# Patient Record
Sex: Female | Born: 1990 | Race: White | Hispanic: No | Marital: Single | Smoking: Current every day smoker
Health system: Southern US, Community
[De-identification: ages and names within clinical notes are randomized; demographics above are authoritative.]

## PROBLEM LIST (undated history)

## (undated) DIAGNOSIS — R569 Unspecified convulsions: Secondary | ICD-10-CM

## (undated) DIAGNOSIS — G8929 Other chronic pain: Secondary | ICD-10-CM

## (undated) DIAGNOSIS — M549 Dorsalgia, unspecified: Secondary | ICD-10-CM

## (undated) HISTORY — PX: BACK SURGERY: SHX140

---

## 2012-02-13 ENCOUNTER — Emergency Department (HOSPITAL_COMMUNITY)
Admission: EM | Admit: 2012-02-13 | Discharge: 2012-02-13 | Disposition: A | Discharge status: Home / Self Care | Payer: Self-pay | Attending: Emergency Medicine | Admitting: Emergency Medicine

## 2012-02-13 ENCOUNTER — Encounter (HOSPITAL_COMMUNITY): Payer: Self-pay

## 2012-02-13 DIAGNOSIS — F17200 Nicotine dependence, unspecified, uncomplicated: Secondary | ICD-10-CM | POA: Insufficient documentation

## 2012-02-13 DIAGNOSIS — K0889 Other specified disorders of teeth and supporting structures: Secondary | ICD-10-CM

## 2012-02-13 DIAGNOSIS — K089 Disorder of teeth and supporting structures, unspecified: Secondary | ICD-10-CM | POA: Insufficient documentation

## 2012-02-13 MED ORDER — ERYTHROMYCIN BASE 250 MG PO TABS: 250.0000 mg | ORAL_TABLET | Freq: Four times a day (QID) | ORAL | Status: AC

## 2012-02-13 MED ORDER — HYDROCODONE-ACETAMINOPHEN 5-325 MG PO TABS
2.0000 | ORAL_TABLET | Freq: Once | ORAL | Status: AC
  Administered 2012-02-13: 2 via ORAL
  Filled 2012-02-13: qty 2

## 2012-02-13 MED ORDER — BUPIVACAINE HCL (PF) 0.5 % IJ SOLN
INTRAMUSCULAR | Status: AC
  Filled 2012-02-13: qty 10

## 2012-02-13 MED ORDER — HYDROCODONE-ACETAMINOPHEN 5-325 MG PO TABS: ORAL_TABLET | ORAL | Status: DC

## 2012-02-13 MED ORDER — BUPIVACAINE HCL (PF) 0.5 % IJ SOLN
10.0000 mL | Freq: Once | INTRAMUSCULAR | Status: AC
  Administered 2012-02-13: 10 mL

## 2012-02-13 NOTE — Discharge Instructions (Signed)
Dental Pain A tooth ache may be caused by cavities (tooth decay). Cavities expose the nerve of the tooth to air and hot or cold temperatures. It may come from an infection or abscess (also called a boil or furuncle) around your tooth. It is also often caused by dental caries (tooth decay). This causes the pain you are having. DIAGNOSIS  Your caregiver can diagnose this problem by exam. TREATMENT   If caused by an infection, it may be treated with medications which kill germs (antibiotics) and pain medications as prescribed by your caregiver. Take medications as directed.   Only take over-the-counter or prescription medicines for pain, discomfort, or fever as directed by your caregiver.   Whether the tooth ache today is caused by infection or dental disease, you should see your dentist as soon as possible for further care.  SEEK MEDICAL CARE IF: The exam and treatment you received today has been provided on an emergency basis only. This is not a substitute for complete medical or dental care. If your problem worsens or new problems (symptoms) appear, and you are unable to meet with your dentist, call or return to this location. SEEK IMMEDIATE MEDICAL CARE IF:   You have a fever.   You develop redness and swelling of your face, jaw, or neck.   You are unable to open your mouth.   You have severe pain uncontrolled by pain medicine.  MAKE SURE YOU:   Understand these instructions.   Will watch your condition.   Will get help right away if you are not doing well or get worse.  Document Released: 11/10/2005 Document Revised: 10/30/2011 Document Reviewed: 06/28/2008 [employer] Patient Information 2012 [employer]  RESOURCE GUIDE  Dental Problems  Patients with Medicaid: [facility]                     [facility] [address]                                           [address] Phone:  [phone]                                                   Phone:  [phone]  If unable to pay or uninsured, contact:  [facility] or [facility] to become qualified for the adult dental clinic.  Chronic Pain Problems Contact [facility]  [phone] Patients need to be referred by their primary care doctor.  Insufficient Money for Medicine Contact [employer]:  call "211" or [facility] 271-5999.  No Primary Care Doctor Call [facility]  [phone] Other agencies that provide inexpensive medical care    [facility]  [phone]    [facility]  [phone]    [facility]  [phone]    Women[facility] Clinic  [phone]    [facility]  [phone]    [facility]  [phone]  Psychological Services [facility]  [phone] [facility]  [phone] [facility]   [phone] (emergency services 641-4993)  Substance Abuse Resources [facility]  336-882-2125 [facility] 336-784-9470 [facility] 336-285-9073 [facility] (252)811-6008 Residential & Outpatient Substance Abuse Program  252 246 1162  Abuse/Neglect [facility] Child Abuse Hotline (720)545-1402 [facility] Child Abuse Hotline 636-302-5053 (After Hours)  Emergency Shelter [facility] 442-414-9869  M[facility] Homes [facility] 972-037-7062 [facility] 678-043-1397  MRSA Hotline #:   918-326-4868    [facility] Resources  [facility]     [employer]                          [facility] [address] [place]                       [address]      [address]  Blondell Reveal Phone:  [phone]                                   Phone:  [phone]                 Phone:  [phone]  [facility] Phone:   ([phone]  [facility] Child Abuse Hotline [phone] (After Hours)      Narcotic and benzodiazepine use may cause drowsiness, slowed breathing or dependence.  Please use with caution and do not drive, operate machinery or watch young children alone while taking them.  Taking combinations of these medications or drinking alcohol will potentiate these effects.

## 2012-02-13 NOTE — ED Notes (Signed)
Pt states that for the past week she has been having pain in her left lower tooth area. She states that the area feels swollen. Tylenol pta with no relief.

## 2012-02-13 NOTE — ED Provider Notes (Signed)
History     CSN: [id number]  Arrival date & time 02/13/12  7829   First MD Initiated Contact with Patient 02/13/12 [phone]      Chief Complaint  Patient presents with  . Dental Pain    (Consider location/radiation/quality/duration/timing/severity/associated sxs/prior treatment) HPI Comments: Pt reports gradually worsening pain to teeth in left lower jaw.  Pt has had wisdom teethj removed.  Last had fillings placed by dentist in [place], but unfortunately now staying ina homeless shelter, no dental insurance, no current dentist.  No fevers, chills.  Pain with chewing and on occasion with hot or cold air, not able to smoke cigarettes.  No difficulty swallowing, stiff neck.  Pain worse for past 4-5 days.  Unable to sleep at all last night due to persistent severe pain.    Patient is a 21 y.o. female presenting with tooth pain. The history is provided by the patient and a friend.  Dental PainPrimary symptoms do not include fever, shortness of breath or sore throat.    History reviewed. No pertinent past medical history.  Past Surgical History  Procedure Date  . Back surgery     rod placement.    History reviewed. No pertinent family history.  History  Substance Use Topics  . Smoking status: Current Everyday Smoker -- 0.5 packs/day  . Smokeless tobacco: Not on file  . Alcohol Use: No    OB History    Grav Para Term Preterm Abortions TAB SAB Ect Mult Living                  Review of Systems  Constitutional: Negative for fever and chills.  HENT: Positive for dental problem. Negative for sore throat.   Respiratory: Negative for shortness of breath.     Allergies  Penicillins  Home Medications   Current Outpatient Rx  Name Route Sig Dispense Refill  . ERYTHROMYCIN BASE 250 MG PO TABS Oral Take 1 tablet (250 mg total) by mouth every 6 (six) hours. 28 tablet 0  . HYDROCODONE-ACETAMINOPHEN 5-325 MG PO TABS  1-2 tablets po q 6 hours prn moderate to severe pain 20 tablet  0    BP 117/78  Pulse 78  Temp(Src) 97.7 F (36.5 C) (Oral)  Resp 20  SpO2 100%  LMP 02/02/2012  Physical Exam  Nursing note and vitals reviewed. Constitutional: She appears well-developed and well-nourished. She appears distressed.  HENT:  Head: Normocephalic.  Mouth/Throat: Uvula is midline, oropharynx is clear and moist and mucous membranes are normal.         No sig gum swelling, pus drainage  Neck: Full passive range of motion without pain. Neck supple.       No sig lymphadenopathy  Skin: Skin is warm and dry. No rash noted. She is not diaphoretic.    ED Course  NERVE BLOCK Date/Time: 02/13/2012 10:10 AM Performed by: Lear Ng. Authorized by: Lear Ng Consent: Verbal consent obtained. Risks and benefits: risks, benefits and alternatives were discussed Consent given by: patient Patient understanding: patient states understanding of the procedure being performed Patient consent: the patient's understanding of the procedure matches consent given Patient identity confirmed: verbally with patient Time out: Immediately prior to procedure a "time out" was called to verify the correct patient, procedure, equipment, support staff and site/side marked as required. Indications: pain relief Body area: face/mouth Nerve: inferior alveolar Laterality: left Patient sedated: no Patient position: sitting Needle gauge: 27 G Location technique: anatomical landmarks Local anesthetic: bupivacaine 0.5% without epinephrine  Anesthetic total: 2 ml Outcome: pain improved Comments: Minimal bleeding   (including critical care time)  Labs Reviewed - No data to display No results found.   1. Dentalgia     11:01 AM Dr. Jacqualin Combes office called, will see pt at 10 AM Monday.    MDM  Pt iwht no visible occult trauma, no obv cavities, no abscess.  Nerve block performed.  Will try to make an appt for her with dentist, Dr. Oswaldo Done who is on call for adult . Informed pt that  we will try to arrange appt.  She doesn't have dental insurance at present.  Will give her Rx for erythromycin and norco to last weekend.          Gavin Pound. Aldine Chakraborty, MD 02/13/12 1101

## 2012-02-13 NOTE — ED Notes (Signed)
Physician Assisted Procedure: Assisted with dental block. Will continue to assess for pain

## 2012-02-16 ENCOUNTER — Emergency Department (HOSPITAL_COMMUNITY)
Admission: EM | Admit: 2012-02-16 | Discharge: 2012-02-16 | Disposition: A | Discharge status: Home / Self Care | Payer: Self-pay | Attending: Emergency Medicine | Admitting: Emergency Medicine

## 2012-02-16 ENCOUNTER — Encounter (HOSPITAL_COMMUNITY): Payer: Self-pay

## 2012-02-16 DIAGNOSIS — F17200 Nicotine dependence, unspecified, uncomplicated: Secondary | ICD-10-CM | POA: Insufficient documentation

## 2012-02-16 DIAGNOSIS — K0262 Dental caries on smooth surface penetrating into dentin: Secondary | ICD-10-CM

## 2012-02-16 MED ORDER — OXYCODONE-ACETAMINOPHEN 5-325 MG PO TABS
2.0000 | ORAL_TABLET | Freq: Once | ORAL | Status: AC
  Administered 2012-02-16: 2 via ORAL
  Filled 2012-02-16: qty 2

## 2012-02-16 MED ORDER — CLINDAMYCIN HCL 300 MG PO CAPS
300.0000 mg | ORAL_CAPSULE | Freq: Once | ORAL | Status: AC
  Administered 2012-02-16: 300 mg via ORAL
  Filled 2012-02-16: qty 1

## 2012-02-16 NOTE — ED Provider Notes (Signed)
Medical screening examination/treatment/procedure(s) were performed by non-physician practitioner and as supervising physician I was immediately available for consultation/collaboration.   Vida Roller, MD 02/16/12 [phone]

## 2012-02-16 NOTE — ED Notes (Signed)
Patient c/o  Left-sided facial/jaw pain x 1 week. Reports unable to chew/eat d/t pain.

## 2012-02-16 NOTE — ED Notes (Signed)
Per [facility]- pt seen at [facility] 2 days ago and given norco,  Pt reports no change in pain. Pt report jaw pain left side x 3 days.  Pt reports dental caries on several teeth

## 2012-02-16 NOTE — Discharge Instructions (Signed)
Dental Caries  Tooth decay (dental caries, cavities) is the most common of all oral diseases. It occurs in all ages but is more common in children and young adults.  CAUSES  Bacteria in your mouth combine with foods (particularly sugars and starches) to produce plaque. Plaque is a substance that sticks to the hard surfaces of teeth. The bacteria in the plaque produce acids that attack the enamel of teeth. Repeated acid attacks dissolve the enamel and create holes in the teeth. Root surfaces of teeth may also get these holes.  Other contributing factors include:   Frequent snacking and drinking of cavity-producing foods and liquids.   Poor oral hygiene.   Dry mouth.   Substance abuse such as methamphetamine.   Broken or poor fitting dental restorations.   Eating disorders.   Gastroesophageal reflux disease (GERD).   Certain radiation treatments to the head and neck.  SYMPTOMS  At first, dental decay appears as white, chalky areas on the enamel. In this early stage, symptoms are seldom present. As the decay progresses, pits and holes may appear on the enamel surfaces. Progression of the decay will lead to softening of the hard layers of the tooth. At this point you may experience some pain or achy feeling after sweet, hot, or cold foods or drinks are consumed. If left untreated, the decay will reach the internal structures of the tooth and produce severe pain. Extensive dental treatment, such as root canal therapy, may be needed to save the tooth at this late stage of decay development.  DIAGNOSIS  Most cavities will be detected during regular check-ups. A thorough medical and dental history will be taken by the dentist. The dentist will use instruments to check the surfaces of your teeth for any breakdown or discoloration. Some dentists have special instruments, such as lasers, that detect tooth decay. Dental X-rays may also show some cavities that are not visible to the eye (such as between  the contact areas of the teeth). TREATMENT  Treatment involves removal of the tooth decay and replacement with a restorative material such as silver, gold, or composite (white) material. However, if the decay involves a large area of the tooth and there is little remaining healthy tooth structure, a cap (crown) will be fitted over the remaining structure. If the decay involves the center part of the tooth (pulp), root canal treatment will be needed before any type of dental restoration is placed. If the tooth is severely destroyed by the decay process, leaving the remaining tooth structures unrestorable, the tooth will need to be pulled (extracted). Some early tooth decay may be reversed by fluoride treatments and thorough brushing and flossing at home. PREVENTION   Eat healthy foods. Restrict the amount of sugary, starchy foods and liquids you consume. Avoid frequent snacking and drinking of unhealthy foods and liquids.   Sealants can help with prevention of cavities. Sealants are composite resins applied onto the biting surfaces of teeth at risk for decay. They smooth out the pits and grooves and prevent food from being trapped in them. This is done in early childhood before tooth decay has started.   Fluoride tablets may also be prescribed to children between 6 months and 10 years of age if your drinking water is not fluoridated. The fluoride absorbed by the tooth enamel makes teeth less susceptible to decay. Thorough daily cleaning with a toothbrush and dental floss is the best way to prevent cavities. Use of a fluoride toothpaste is highly recommended. Fluoride mouth rinses   may be used in specific cases.   Topical application of fluoride by your dentist is important in children.   Regular visits with a dentist for checkups and cleanings are also important.  SEEK IMMEDIATE DENTAL CARE IF:  You have a fever.   You develop redness and swelling of your face, jaw, or neck.   You develop swelling  around a tooth.   You are unable to open your mouth or cannot swallow.   You have severe pain uncontrolled by pain medicine.  Document Released: 08/02/2002 Document Revised: 10/30/2011 Document Reviewed: 04/17/2011 [facility] Patient Information 2012 [place], [place]. X-ray keep her dental appointment today

## 2012-02-16 NOTE — ED Provider Notes (Signed)
History     CSN: [id number]  Arrival date & time 02/16/12  0037   First MD Initiated Contact with Patient 02/16/12 0310      Chief Complaint  Patient presents with  . Jaw Pain  . Dental Pain    (Consider location/radiation/quality/duration/timing/severity/associated sxs/prior treatment) HPI Comments: Patient was seen 3 days ago at [facility] for cavity.again, arrange for a dental appointment for her but this morning.  She comes in complaining of increased pain, not being controlled by the Vicodin that she is taking at home.  She does have some submental tenderness, as well  Patient is a 21 y.o. female presenting with tooth pain. The history is provided by the patient.  Dental PainThe primary symptoms include oral lesions. Primary symptoms do not include dental injury, oral bleeding, headaches, fever or sore throat. The symptoms began more than 1 week ago. The symptoms are unchanged. The symptoms occur constantly.  Additional symptoms include: dental sensitivity to temperature and jaw pain. Additional symptoms do not include: gum tenderness, purulent gums, trismus, facial swelling and trouble swallowing.    History reviewed. No pertinent past medical history.  Past Surgical History  Procedure Date  . Back surgery     rod placement.    No family history on file.  History  Substance Use Topics  . Smoking status: Current Everyday Smoker -- 0.5 packs/day  . Smokeless tobacco: Not on file  . Alcohol Use: No    OB History    Grav Para Term Preterm Abortions TAB SAB Ect Mult Living                  Review of Systems  Constitutional: Negative for fever.  HENT: Positive for dental problem. Negative for congestion, sore throat, facial swelling, mouth sores and trouble swallowing.   Neurological: Negative for dizziness and headaches.    Allergies  Penicillins  Home Medications   Current Outpatient Rx  Name Route Sig Dispense Refill  . ACETAMINOPHEN 500 MG PO TABS Oral  Take 1,000 mg by mouth every 6 (six) hours as needed. pain    . ERYTHROMYCIN BASE 250 MG PO TABS Oral Take 1 tablet (250 mg total) by mouth every 6 (six) hours. 28 tablet 0  . HYDROCODONE-ACETAMINOPHEN 5-325 MG PO TABS  1-2 tablets po q 6 hours prn moderate to severe pain 20 tablet 0    BP 145/93  Pulse 93  Temp(Src) 97.9 F (36.6 C) (Oral)  SpO2 100%  LMP 02/02/2012  Physical Exam  Constitutional: She appears well-developed and well-nourished.  HENT:  Head: Normocephalic.  Mouth/Throat: Uvula is midline. Dental caries present.       Patient has submental midline tenderness.  No gum swelling  Eyes: Pupils are equal, round, and reactive to light.  Neck: Normal range of motion.  Cardiovascular: Normal rate.   Pulmonary/Chest: Effort normal.  Musculoskeletal: Normal range of motion.  Neurological: She is alert.  Skin: Skin is warm.    ED Course  Procedures (including critical care time)  Labs Reviewed - No data to display No results found.   No diagnosis found.    MDM  Since the patient has a dental appointment will provide additional pain control in the department.  Also have started clindamycin 300 mg for potential dental infection and encourage patient to keep her appointment today I placed a small amount of dental cement in the cavity.  Hopefully this will provide some relief.  Temperature sensitivity  Arman Filter, NP 02/16/12 [phone]

## 2012-02-26 ENCOUNTER — Emergency Department (HOSPITAL_COMMUNITY)
Admission: EM | Admit: 2012-02-26 | Discharge: 2012-02-27 | Disposition: A | Discharge status: Home / Self Care | Payer: Self-pay | Attending: Emergency Medicine | Admitting: Emergency Medicine

## 2012-02-26 ENCOUNTER — Emergency Department (HOSPITAL_COMMUNITY): Payer: Self-pay

## 2012-02-26 ENCOUNTER — Encounter (HOSPITAL_COMMUNITY): Payer: Self-pay | Admitting: Emergency Medicine

## 2012-02-26 DIAGNOSIS — J40 Bronchitis, not specified as acute or chronic: Secondary | ICD-10-CM | POA: Insufficient documentation

## 2012-02-26 DIAGNOSIS — F17200 Nicotine dependence, unspecified, uncomplicated: Secondary | ICD-10-CM | POA: Insufficient documentation

## 2012-02-26 DIAGNOSIS — R231 Pallor: Secondary | ICD-10-CM | POA: Insufficient documentation

## 2012-02-26 LAB — BASIC METABOLIC PANEL
BUN: 10 mg/dL (ref 6–23)
CO2: 25 mEq/L (ref 19–32)
Calcium: 9.1 mg/dL (ref 8.4–10.5)
Chloride: 105 mEq/L (ref 96–112)
Creatinine, Ser: 0.71 mg/dL (ref 0.50–1.10)
GFR calc Af Amer: >90 mL/min (ref >90)
GFR calc non Af Amer: >90 mL/min (ref >90)
Glucose, Bld: 88 mg/dL (ref 70–99)
Potassium: 3.8 mEq/L (ref 3.5–5.1)
Sodium: 140 mEq/L (ref 135–145)

## 2012-02-26 LAB — DIFFERENTIAL
Basophils Absolute: 0 10*3/uL (ref 0.0–0.1)
Basophils Relative: 0 % (ref 0–1)
Lymphocytes Relative: 20 % (ref 12–46)
Neutro Abs: 7.3 10*3/uL (ref 1.7–7.7)

## 2012-02-26 LAB — CBC
HCT: 42.7 % (ref 36.0–46.0)
Hemoglobin: 14.2 g/dL (ref 12.0–15.0)
MCH: 30.7 pg (ref 26.0–34.0)
MCHC: 33.3 g/dL (ref 30.0–36.0)
MCV: 92.4 fL (ref 78.0–100.0)
Platelets: 186 10*3/uL (ref 150–400)
RBC: 4.62 MIL/uL (ref 3.87–5.11)
RDW: 14.3 % (ref 11.5–15.5)
WBC: 10.4 10*3/uL (ref 4.0–10.5)

## 2012-02-26 LAB — POCT PREGNANCY, URINE: Preg Test, Ur: NEGATIVE

## 2012-02-26 MED ORDER — ONDANSETRON 4 MG PO TBDP
4.0000 mg | ORAL_TABLET | Freq: Once | ORAL | Status: AC
  Administered 2012-02-27: 4 mg via ORAL
  Filled 2012-02-26: qty 1

## 2012-02-26 MED ORDER — PROMETHAZINE-CODEINE 6.25-10 MG/5ML PO SYRP
5.0000 mL | ORAL_SOLUTION | Freq: Once | ORAL | Status: AC
  Administered 2012-02-27: 5 mL via ORAL
  Filled 2012-02-26: qty 5

## 2012-02-26 NOTE — ED Notes (Signed)
PT. REPORTS PRODUCTIVE COUGH WITH BODY ACHES AND VOMITTING / SOB WITH CHEST CONGESTION FOR 3 DAYS .

## 2012-02-26 NOTE — ED Notes (Signed)
Pt reports having n/v/d, coughing, headache, body aches and chest pain x 4 days. Pt reports "i have been coughing up blood chunks" with vomit color "pink". Pt INAD, MAE x4, skin w/d and resp e/u, pt ambulatory in room and hallway with steady gait.

## 2012-02-26 NOTE — ED Notes (Signed)
I gave the patient a warm blanket. 

## 2012-02-27 LAB — URINALYSIS, ROUTINE W REFLEX MICROSCOPIC
Glucose, UA: NEGATIVE mg/dL
Ketones, ur: 15 mg/dL — AB
Nitrite: NEGATIVE
Protein, ur: NEGATIVE mg/dL
pH: 6 (ref 5.0–8.0)

## 2012-02-27 LAB — URINE MICROSCOPIC-ADD ON

## 2012-02-27 MED ORDER — PROMETHAZINE-CODEINE 6.25-10 MG/5ML PO SYRP: 5.0000 mL | ORAL_SOLUTION | ORAL | Status: AC | PRN

## 2012-02-27 NOTE — ED Notes (Signed)
Pt is awaiting decrease in side effects of medication previously given before discharge. Plan of care was updated with pt with verbal understanding. Will continue to monitor pt, pt has had no n/v/d or coughing observed or reported at this time.

## 2012-02-27 NOTE — Discharge Instructions (Signed)
Bronchitis Bronchitis is a problem of the air tubes leading to your lungs. This problem makes it hard for air to get in and out of the lungs. You may cough a lot because your air tubes are narrow. Going without care can cause lasting (chronic) bronchitis. HOME CARE   Drink enough fluids to keep your pee (urine) clear or pale yellow.   Use a cool mist humidifier.   Quit smoking if you smoke. If you keep smoking, the bronchitis might not get better.   Only take medicine as told by your doctor.  GET HELP RIGHT AWAY IF:   Coughing keeps you awake.   You start to wheeze.   You become more sick or weak.   You have a hard time breathing or get short of breath.   You cough up blood.   Coughing lasts more than 2 weeks.   You have a fever.   Your baby is older than 3 months with a rectal temperature of 102 F (38.9 C) or higher.   Your baby is 13 months old or younger with a rectal temperature of 100.4 F (38 C) or higher.  MAKE SURE YOU:  Understand these instructions.   Will watch your condition.   Will get help right away if you are not doing well or get worse.  Document Released: 04/28/2008 Document Revised: 10/30/2011 Document Reviewed: 10/12/2009 [facility] Patient Information 2012 [place], [place]. You have been given a prescription for cough.  Please use as indicated.  Please try to drink 20 of fluids and rest

## 2012-02-27 NOTE — ED Provider Notes (Signed)
Medical screening examination/treatment/procedure(s) were performed by non-physician practitioner and as supervising physician I was immediately available for consultation/collaboration.  Olivia Mackie, MD 02/27/12 [phone]

## 2012-02-27 NOTE — ED Provider Notes (Signed)
History     CSN: [id number]  Arrival date & time 02/26/12  2246   First MD Initiated Contact with Patient 02/26/12 2353      Chief Complaint  Patient presents with  . Cough    (Consider location/radiation/quality/duration/timing/severity/associated sxs/prior treatment) HPI Comments: Patient states, that she's had a nonproductive cough that is causing her to have some emesis.  Bilateral upper abdomen and rib pain exacerbated by the cough and nausea for the past 3  Patient is a 21 y.o. female presenting with cough. The history is provided by the patient.  Cough This is a new problem. The cough is non-productive. There has been no fever. Associated symptoms include myalgias. Pertinent negatives include no shortness of breath and no wheezing.    History reviewed. No pertinent past medical history.  Past Surgical History  Procedure Date  . Back surgery     rod placement.    No family history on file.  History  Substance Use Topics  . Smoking status: Current Everyday Smoker -- 0.5 packs/day  . Smokeless tobacco: Not on file  . Alcohol Use: No    OB History    Grav Para Term Preterm Abortions TAB SAB Ect Mult Living                  Review of Systems  Respiratory: Positive for cough. Negative for shortness of breath and wheezing.   Musculoskeletal: Positive for myalgias.  Skin: Positive for pallor.  All other systems reviewed and are negative.    Allergies  Penicillins; Aspirin; and Ibuprofen  Home Medications   Current Outpatient Rx  Name Route Sig Dispense Refill  . OVER THE COUNTER MEDICATION Oral Take 30 mLs by mouth every 6 (six) hours as needed. For sick symptoms otc product similar to nyquil    . PROMETHAZINE-CODEINE 6.25-10 MG/5ML PO SYRP Oral Take 5 mLs by mouth every 4 (four) hours as needed for cough. 120 mL 0    LMP 02/02/2012  Physical Exam  Constitutional: She is oriented to person, place, and time. She appears well-developed and  well-nourished.  HENT:  Head: Normocephalic.  Neck: Normal range of motion.  Cardiovascular: Normal rate.   Pulmonary/Chest: Effort normal. She has no wheezes. She exhibits tenderness.  Abdominal: Soft. She exhibits no distension. There is no tenderness.  Neurological: She is alert and oriented to person, place, and time.  Skin: There is pallor.    ED Course  Procedures (including critical care time)  Labs Reviewed  URINALYSIS, ROUTINE W REFLEX MICROSCOPIC - Abnormal; Notable for the following:    APPearance CLOUDY (*)    Ketones, ur 15 (*)    Leukocytes, UA MODERATE (*)    All other components within normal limits  URINE MICROSCOPIC-ADD ON - Abnormal; Notable for the following:    Bacteria, UA FEW (*)    Crystals CA OXALATE CRYSTALS (*)    All other components within normal limits  CBC  DIFFERENTIAL  BASIC METABOLIC PANEL  POCT PREGNANCY, URINE   Dg Chest 2 View  02/26/2012  *RADIOLOGY REPORT*  Clinical Data: Cough and congestion.  CHEST - 2 VIEW  Comparison: None  Findings: The cardiac silhouette, mediastinal and hilar contours are within normal.  The lungs are clear.  No pleural effusion.  The bony thorax is intact.  Cervical lumbar fusion hardware is noted.  IMPRESSION: No acute cardiopulmonary findings.  Original Report Authenticated By: P. Loralie Champagne, M.D.     1. Bronchitis  MDM  Most likely bronchitis with posttussive emesis and rib pain        Arman Filter, NP 02/27/12 0042  Arman Filter, NP 02/27/12 [phone]

## 2012-03-25 ENCOUNTER — Emergency Department (HOSPITAL_COMMUNITY): Payer: Self-pay

## 2012-03-25 ENCOUNTER — Encounter (HOSPITAL_COMMUNITY): Payer: Self-pay | Admitting: Emergency Medicine

## 2012-03-25 ENCOUNTER — Ambulatory Visit (HOSPITAL_COMMUNITY): Payer: Self-pay

## 2012-03-25 ENCOUNTER — Emergency Department (HOSPITAL_COMMUNITY)
Admission: EM | Admit: 2012-03-25 | Discharge: 2012-03-25 | Discharge status: Against Medical Advice | Payer: Self-pay | Attending: Emergency Medicine | Admitting: Emergency Medicine

## 2012-03-25 DIAGNOSIS — T7840XA Allergy, unspecified, initial encounter: Secondary | ICD-10-CM

## 2012-03-25 DIAGNOSIS — F17200 Nicotine dependence, unspecified, uncomplicated: Secondary | ICD-10-CM | POA: Insufficient documentation

## 2012-03-25 DIAGNOSIS — F445 Conversion disorder with seizures or convulsions: Secondary | ICD-10-CM

## 2012-03-25 DIAGNOSIS — Z91040 Latex allergy status: Secondary | ICD-10-CM | POA: Insufficient documentation

## 2012-03-25 DIAGNOSIS — L293 Anogenital pruritus, unspecified: Secondary | ICD-10-CM | POA: Insufficient documentation

## 2012-03-25 DIAGNOSIS — R05 Cough: Secondary | ICD-10-CM | POA: Insufficient documentation

## 2012-03-25 DIAGNOSIS — R569 Unspecified convulsions: Secondary | ICD-10-CM | POA: Insufficient documentation

## 2012-03-25 DIAGNOSIS — R059 Cough, unspecified: Secondary | ICD-10-CM | POA: Insufficient documentation

## 2012-03-25 DIAGNOSIS — R42 Dizziness and giddiness: Secondary | ICD-10-CM | POA: Insufficient documentation

## 2012-03-25 LAB — URINE MICROSCOPIC-ADD ON

## 2012-03-25 LAB — URINALYSIS, ROUTINE W REFLEX MICROSCOPIC
Glucose, UA: NEGATIVE mg/dL
Hgb urine dipstick: NEGATIVE
Protein, ur: NEGATIVE mg/dL
pH: 6.5 (ref 5.0–8.0)

## 2012-03-25 MED ORDER — LORAZEPAM 1 MG PO TABS
1.0000 mg | ORAL_TABLET | Freq: Once | ORAL | Status: AC
  Administered 2012-03-25: 1 mg via ORAL
  Filled 2012-03-25: qty 1

## 2012-03-25 NOTE — ED Notes (Signed)
[id number]<AV> Expected date:<BR> Expected time:<BR> Means of arrival:<BR> Comments:<BR> EMS/pseudo-seizure-exposure to latex/&quot;doesn&#39;t feel right&quot;

## 2012-03-25 NOTE — ED Notes (Signed)
Brought in by EMS from home with c/o "seizure".  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 Pt took 2 tablets of Benadryl prior to EMS arrival at her home. Pt c/o "chest tremors".

## 2012-03-25 NOTE — ED Provider Notes (Signed)
Medical screening examination/treatment/procedure(s) were performed by non-physician practitioner and as supervising physician I was immediately available for consultation/collaboration.  Aritzel Krusemark M Yolunda Kloos, MD 03/25/12 0735 

## 2012-03-25 NOTE — ED Provider Notes (Signed)
History     CSN: [id number]  Arrival date & time 03/25/12  4098   First MD Initiated Contact with Patient 03/25/12 0345      Chief Complaint  Patient presents with  . Allergic Reaction  . Tremors  . Dizziness  . Seizures    (Consider location/radiation/quality/duration/timing/severity/associated sxs/prior treatment) Patient is a 21 y.o. female presenting with seizures. The history is provided by the patient, the EMS personnel and a relative.  Seizures  This is a new problem. The current episode started 3 to 5 hours ago. The problem has been resolved. There were 2 to 3 seizures. The most recent episode lasted less than 30 seconds. Associated symptoms include headaches. Characteristics include eye blinking, rhythmic jerking and loss of consciousness. Characteristics do not include bowel incontinence, bladder incontinence or bit tongue. The episode was witnessed.  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 She denies any recent changes in health or medications. States she is having chest pain and coughing. States "I have a viral respiratory infection." Stats also earlier today, had intercourse and used a latex condom. States she is allergic to latex, reports genetalia itching. Took 2 benadryl tabs prior to coming. Denis fever, chills, malaise. Denis hitting her head  History reviewed. No pertinent past medical history.  Past Surgical History  Procedure Date  . Back surgery     rod placement.    History reviewed. No pertinent family history.  History  Substance Use Topics  . Smoking status: Current Everyday Smoker -- 0.5 packs/day  . Smokeless tobacco: Not on file  . Alcohol Use: No    OB History    Grav Para Term Preterm Abortions TAB SAB Ect Mult Living                  Review of Systems  Constitutional: Negative for fever and chills.  Eyes: Negative.   Respiratory: Negative.    Cardiovascular: Negative.   Gastrointestinal: Negative.  Negative for bowel incontinence.  Genitourinary: Positive for vaginal pain. Negative for bladder incontinence, dysuria, vaginal bleeding and vaginal discharge.  Musculoskeletal: Negative.   Skin: Negative.   Neurological: Positive for dizziness, tremors, seizures, loss of consciousness and headaches.  Psychiatric/Behavioral: The patient is nervous/anxious.     Allergies  Penicillins; Aspirin; and Ibuprofen  Home Medications   Current Outpatient Rx  Name Route Sig Dispense Refill  . DIPHENHYDRAMINE HCL 25 MG PO TABS Oral Take 50 mg by mouth every 6 (six) hours as needed. Allergic reaction      BP 115/51  Pulse 66  Temp(Src) 98.3 F (36.8 C) (Oral)  Resp 16  SpO2 100%  Physical Exam  Vitals reviewed. Constitutional: She is oriented to person, place, and time. She appears well-developed and well-nourished. No distress.  HENT:  Head: Normocephalic and atraumatic.       Tongue normal, no lacerations  Eyes: Conjunctivae are normal.  Neck: Normal range of motion. Neck supple.  Cardiovascular: Normal rate, regular rhythm and normal heart sounds.   Pulmonary/Chest: Effort normal and breath sounds normal. No respiratory distress. She has no wheezes. She has no rales.  Abdominal: Soft. Bowel sounds are normal. She exhibits no distension. There is no tenderness. There is no rebound.  Musculoskeletal: Normal range of motion. She exhibits no edema.  Neurological: She is alert and oriented to person, place, and time. She has normal reflexes. No cranial nerve deficit. Coordination  normal.  Skin: Skin is warm and dry.  Psychiatric:       Anxious appearing    ED Course  Procedures (including critical care time) Pt with 3 "seizures" today, also allergic reaction, cough. Will get labs, cxr, ua, urine preg. Ativan ordered for anxiety.   5:50 AM Nurse informed me that pt left ama without informing staff.    No results  found.   1. Pseudoseizure   2. Allergic reaction   3. Cough       MDM          Lottie Mussel, PA 03/25/12 [phone]

## 2012-03-25 NOTE — ED Notes (Signed)
Pt refusing labs per RN

## 2012-06-22 ENCOUNTER — Encounter (HOSPITAL_COMMUNITY): Payer: Self-pay | Admitting: *Deleted

## 2012-06-22 ENCOUNTER — Emergency Department (HOSPITAL_COMMUNITY)
Admission: EM | Admit: 2012-06-22 | Discharge: 2012-06-22 | Discharge status: Against Medical Advice | Payer: No Typology Code available for payment source | Attending: Emergency Medicine | Admitting: Emergency Medicine

## 2012-06-22 DIAGNOSIS — F17200 Nicotine dependence, unspecified, uncomplicated: Secondary | ICD-10-CM | POA: Insufficient documentation

## 2012-06-22 DIAGNOSIS — S139XXA Sprain of joints and ligaments of unspecified parts of neck, initial encounter: Secondary | ICD-10-CM

## 2012-06-22 DIAGNOSIS — T148XXA Other injury of unspecified body region, initial encounter: Secondary | ICD-10-CM

## 2012-06-22 DIAGNOSIS — Z043 Encounter for examination and observation following other accident: Secondary | ICD-10-CM | POA: Insufficient documentation

## 2012-06-22 MED ORDER — HYDROCODONE-ACETAMINOPHEN 5-325 MG PO TABS
2.0000 | ORAL_TABLET | Freq: Once | ORAL | Status: AC
  Administered 2012-06-22: 2 via ORAL
  Filled 2012-06-22: qty 2

## 2012-06-22 MED ORDER — DIAZEPAM 5 MG PO TABS
5.0000 mg | ORAL_TABLET | Freq: Once | ORAL | Status: AC
  Administered 2012-06-22: 5 mg via ORAL
  Filled 2012-06-22: qty 1
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
## 2012-06-22 NOTE — ED Notes (Signed)
Informed MD of pt request to leave. Pt continues to state she would like to be discharged at this time.

## 2012-06-22 NOTE — ED Notes (Signed)
Pt reports numbness that intermitently shoots down back
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
## 2012-06-22 NOTE — ED Notes (Signed)
Pt ambulatory, walking out of room with boyfriend, refuses to sign AMA form, NAD, A&O. Pt sts she wants to go to another hospital. Encouraged pt to stay and that her insurance may not pay if she leaves. MD made aware.

## 2012-06-22 NOTE — ED Notes (Signed)
Pt standing at doorway, sts she needs to leave now because her boyfriend needs to leave, sts her pain meds did not help and she wants to leave. INformed pt I would find status of her stay. Pt A&O but sounds droggy. NAD.
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
## 2012-06-22 NOTE — ED Notes (Signed)
Pt visiting family member in [place]

## 2012-07-12 ENCOUNTER — Encounter (HOSPITAL_COMMUNITY): Payer: Self-pay | Admitting: *Deleted

## 2012-07-12 ENCOUNTER — Emergency Department (HOSPITAL_COMMUNITY)
Admission: EM | Admit: 2012-07-12 | Discharge: 2012-07-12 | Disposition: A | Discharge status: Home / Self Care | Payer: Self-pay | Attending: Emergency Medicine | Admitting: Emergency Medicine

## 2012-07-12 ENCOUNTER — Emergency Department (HOSPITAL_COMMUNITY): Payer: Self-pay

## 2012-07-12 ENCOUNTER — Emergency Department (HOSPITAL_COMMUNITY)
Admission: EM | Admit: 2012-07-12 | Discharge: 2012-07-13 | Disposition: A | Discharge status: Home / Self Care | Payer: Self-pay | Attending: Emergency Medicine | Admitting: Emergency Medicine

## 2012-07-12 DIAGNOSIS — F17200 Nicotine dependence, unspecified, uncomplicated: Secondary | ICD-10-CM | POA: Insufficient documentation

## 2012-07-12 DIAGNOSIS — G8929 Other chronic pain: Secondary | ICD-10-CM | POA: Insufficient documentation

## 2012-07-12 DIAGNOSIS — K029 Dental caries, unspecified: Secondary | ICD-10-CM | POA: Insufficient documentation

## 2012-07-12 DIAGNOSIS — N390 Urinary tract infection, site not specified: Secondary | ICD-10-CM | POA: Insufficient documentation

## 2012-07-12 DIAGNOSIS — M549 Dorsalgia, unspecified: Secondary | ICD-10-CM | POA: Insufficient documentation

## 2012-07-12 DIAGNOSIS — K0889 Other specified disorders of teeth and supporting structures: Secondary | ICD-10-CM

## 2012-07-12 HISTORY — DX: Other chronic pain: G89.29

## 2012-07-12 HISTORY — DX: Dorsalgia, unspecified: M54.9

## 2012-07-12 LAB — URINE MICROSCOPIC-ADD ON

## 2012-07-12 LAB — URINALYSIS, ROUTINE W REFLEX MICROSCOPIC
Glucose, UA: NEGATIVE mg/dL
Specific Gravity, Urine: 1.028 (ref 1.005–1.030)
Urobilinogen, UA: 1 mg/dL (ref 0.0–1.0)

## 2012-07-12 MED ORDER — HYDROCODONE-ACETAMINOPHEN 5-325 MG PO TABS
2.0000 | ORAL_TABLET | Freq: Once | ORAL | Status: AC
  Administered 2012-07-12: 2 via ORAL
  Filled 2012-07-12: qty 2

## 2012-07-12 MED ORDER — CEPHALEXIN 500 MG PO CAPS: 500.0000 mg | ORAL_CAPSULE | Freq: Four times a day (QID) | ORAL | Status: AC

## 2012-07-12 NOTE — ED Provider Notes (Signed)
History   This chart was scribed for Shelby Adkins B. Bernette Mayers, MD by Melba Coon. The patient was seen in room TR02C/TR02C and the patient's care was started at 12:08AM.    CSN: [id number]  Arrival date & time 07/12/12  2351   None     Chief Complaint  Patient presents with  . Dental Pain    (Consider location/radiation/quality/duration/timing/severity/associated sxs/prior treatment) HPI Shelby Adkins is a 21 y.o. female who presents to the Emergency Department complaining of constant, moderate left upper dental pain with an onset a month ago. Pt bit on her piece of hard candy which triggered the pain. Pt also c/o fevers at home. Motrin and Tylenol at home alleviated the pain. No fever, neck pain, sore throat, rash, CP, SOB, abd pain, n/v/d, dysuria, or extremity pain, edema, weakness, numbness, or tingling. Allergic to penicillins, ASA, and ibuprofen. Hx of chronic back pain; pt was here earlier yesterday morning for her back pain. No other pertinent medical symptoms.  Past Medical History  Diagnosis Date  . Chronic back pain     Past Surgical History  Procedure Date  . Back surgery     rod placement.    History reviewed. No pertinent family history.  History  Substance Use Topics  . Smoking status: Current Everyday Smoker -- 0.5 packs/day  . Smokeless tobacco: Not on file  . Alcohol Use: Yes     occasionally    OB History    Grav Para Term Preterm Abortions TAB SAB Ect Mult Living                  Review of Systems 10 Systems reviewed and all are negative for acute change except as noted in the HPI.   Allergies  Penicillins; Aspirin; and Ibuprofen  Home Medications   Current Outpatient Rx  Name Route Sig Dispense Refill  . CEPHALEXIN 500 MG PO CAPS Oral Take 1 capsule (500 mg total) by mouth 4 (four) times daily. 40 capsule 0  . RA GUMMY VITAMINS & MINERALS PO Oral Take 1 tablet by mouth daily.      BP 98/82  Pulse 100  Temp 98.2 F (36.8 C) (Oral)   Resp 16  SpO2 98%  LMP 07/12/2012  Physical Exam  Constitutional: She is oriented to person, place, and time. She appears well-developed and well-nourished.  HENT:  Head: Normocephalic and atraumatic.       Large dental cavity over left upper 1st molar.  Neck: Neck supple.  Pulmonary/Chest: Effort normal.  Neurological: She is alert and oriented to person, place, and time. No cranial nerve deficit.  Psychiatric: She has a normal mood and affect. Her behavior is normal.    ED Course  Procedures (including critical care time)  DIAGNOSTIC STUDIES: Oxygen Saturation is 98% on room air, normal by my interpretation.    COORDINATION OF CARE:  12:11AM - Pt will be Rx percocet and is advised to use Keflex Rx this morning.   Labs Reviewed - No data to display Dg Thoracic Spine 2 View  07/12/2012  *RADIOLOGY REPORT*  Clinical Data: Injury with pain  THORACIC SPINE - 2 VIEW  Comparison: None.  Findings: There is mild curvature convex to the right.  There is no abnormality at T11 or above.  Thoracolumbar fusion begins at T12. See lumbar report.  IMPRESSION: Mild thoracic curvature.  No acute finding in the thoracic region. See lumbar report.   Original Report Authenticated By: Thomasenia Sales, M.D. ( 07/12/2012 11:13:44 )  Dg Lumbar Spine Complete  07/12/2012  *RADIOLOGY REPORT*  Clinical Data: Previous fusion.  Trauma.  Pain.  LUMBAR SPINE - COMPLETE 4+ VIEW  Comparison: None.  Findings: There is an old compression fracture at L1 with loss of height anteriorly of 70%.  There is thoracolumbar fusion from T12- L2 with pedicle screws and posterior rods.  Components appear well positioned.  No evidence of loosening.  No abnormality below that in the lumbar region.  Sacroiliac joints are normal.  IMPRESSION: No acute finding.  Previous fusion T12-L2 for treatment of an old L1 compression fracture.   Original Report Authenticated By: Thomasenia Sales, M.D. ( 07/12/2012 11:14:55 )      No diagnosis  found.    MDM  Pt with chronic dental pain, seen earlier in the day after a fall.  I personally performed the services described in the documentation, which were scribed in my presence. The recorded information has been reviewed and considered.          Marciel Offenberger B. Bernette Mayers, MD 07/14/12 [phone]
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
## 2012-07-12 NOTE — ED Notes (Signed)
Pt states she has left upper dental pain for more than one month.  States she has had fevers at home.  Has taken motrin and tylenol at home today.
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
## 2012-07-12 NOTE — ED Notes (Signed)
Patient transported to X-ray 
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
## 2012-07-13 MED ORDER — OXYCODONE-ACETAMINOPHEN 5-325 MG PO TABS: 1.0000 | ORAL_TABLET | Freq: Four times a day (QID) | ORAL | Status: AC | PRN

## 2012-07-13 MED ORDER — OXYCODONE-ACETAMINOPHEN 5-325 MG PO TABS
2.0000 | ORAL_TABLET | Freq: Once | ORAL | Status: AC
  Administered 2012-07-13: 2 via ORAL
  Filled 2012-07-13: qty 2

## 2012-08-29 ENCOUNTER — Emergency Department (HOSPITAL_BASED_OUTPATIENT_CLINIC_OR_DEPARTMENT_OTHER)
Admission: EM | Admit: 2012-08-29 | Discharge: 2012-08-29 | Disposition: A | Discharge status: Home / Self Care | Payer: Self-pay

## 2012-08-29 ENCOUNTER — Encounter (HOSPITAL_BASED_OUTPATIENT_CLINIC_OR_DEPARTMENT_OTHER): Payer: Self-pay | Admitting: *Deleted

## 2012-08-29 NOTE — ED Notes (Signed)
Called to triage for upset pt.  Pt states "I dont want to be seen and I will just walk home"  Asked pt if we could at least attempt to call mother.  She said she will not come get her.  Offered to speak to mother and she said "I dont have the number"  When questioned further pt became more agitated and walked out of triage room.  Attempted to stop pt and she said she was just going to calm down.  Pt left building and cannot be found on property.

## 2012-08-29 NOTE — ED Notes (Signed)
Pt brought to ED via [facility]. States she is from [place]. and "asked to be taken to [place] because her mother will not come here to get her". She also states she was "visiting [place] (with her fiance that just left her-broke up)". Presents tearful. C/O abd and chest pain-"I'm just upset-I've had a bad day" While being further questioned, pt stated that she wanted to leave and did not want to be seen. I asked her if she would contact her mother, but she refused. She also stated that she was going to walk home. I told her that we could not allow her to do that because it was not safe. She became very agitated and left out of the triage area. Charge nurse notified and was present for part of this.

## 2017-07-16 ENCOUNTER — Emergency Department (HOSPITAL_COMMUNITY): Payer: Self-pay

## 2017-07-16 ENCOUNTER — Encounter (HOSPITAL_COMMUNITY): Payer: Self-pay | Admitting: Emergency Medicine

## 2017-07-16 ENCOUNTER — Emergency Department (HOSPITAL_COMMUNITY)
Admission: EM | Admit: 2017-07-16 | Discharge: 2017-07-16 | Discharge status: Against Medical Advice | Payer: Self-pay | Attending: Emergency Medicine | Admitting: Emergency Medicine

## 2017-07-16 ENCOUNTER — Other Ambulatory Visit (HOSPITAL_COMMUNITY): Payer: Self-pay

## 2017-07-16 DIAGNOSIS — N72 Inflammatory disease of cervix uteri: Secondary | ICD-10-CM | POA: Insufficient documentation

## 2017-07-16 DIAGNOSIS — Z91040 Latex allergy status: Secondary | ICD-10-CM | POA: Insufficient documentation

## 2017-07-16 DIAGNOSIS — F17210 Nicotine dependence, cigarettes, uncomplicated: Secondary | ICD-10-CM | POA: Insufficient documentation

## 2017-07-16 DIAGNOSIS — Z5329 Procedure and treatment not carried out because of patient's decision for other reasons: Secondary | ICD-10-CM | POA: Insufficient documentation

## 2017-07-16 DIAGNOSIS — R102 Pelvic and perineal pain: Secondary | ICD-10-CM

## 2017-07-16 LAB — URINALYSIS, ROUTINE W REFLEX MICROSCOPIC
Bilirubin Urine: NEGATIVE
Glucose, UA: NEGATIVE mg/dL
Hgb urine dipstick: NEGATIVE
Ketones, ur: NEGATIVE mg/dL
Nitrite: NEGATIVE
PROTEIN: 30 mg/dL — AB
Specific Gravity, Urine: 1.025 (ref 1.005–1.030)
pH: 7 (ref 5.0–8.0)

## 2017-07-16 LAB — COMPREHENSIVE METABOLIC PANEL
ALK PHOS: 54 U/L (ref 38–126)
ALT: 10 U/L — AB (ref 14–54)
AST: 17 U/L (ref 15–41)
Albumin: 3.4 g/dL — ABNORMAL LOW (ref 3.5–5.0)
Anion gap: 6 (ref 5–15)
BUN: 8 mg/dL (ref 6–20)
CO2: 22 mmol/L (ref 22–32)
CREATININE: 0.84 mg/dL (ref 0.44–1.00)
Calcium: 8.3 mg/dL — ABNORMAL LOW (ref 8.9–10.3)
Chloride: 109 mmol/L (ref 101–111)
GLUCOSE: 112 mg/dL — AB (ref 65–99)
Potassium: 3.6 mmol/L (ref 3.5–5.1)
Sodium: 137 mmol/L (ref 135–145)
Total Bilirubin: 0.5 mg/dL (ref 0.3–1.2)
Total Protein: 5.5 g/dL — ABNORMAL LOW (ref 6.5–8.1)

## 2017-07-16 LAB — CBC
HCT: 39.1 % (ref 36.0–46.0)
HEMOGLOBIN: 13 g/dL (ref 12.0–15.0)
MCH: 31.9 pg (ref 26.0–34.0)
MCHC: 33.2 g/dL (ref 30.0–36.0)
MCV: 96.1 fL (ref 78.0–100.0)
Platelets: 210 10*3/uL (ref 150–400)
RBC: 4.07 MIL/uL (ref 3.87–5.11)
RDW: 14.2 % (ref 11.5–15.5)
WBC: 10 10*3/uL (ref 4.0–10.5)

## 2017-07-16 LAB — WET PREP, GENITAL
Clue Cells Wet Prep HPF POC: NONE SEEN
SPERM: NONE SEEN
TRICH WET PREP: NONE SEEN
YEAST WET PREP: NONE SEEN

## 2017-07-16 LAB — LIPASE, BLOOD: Lipase: 35 U/L (ref 11–51)

## 2017-07-16 LAB — I-STAT BETA HCG BLOOD, ED (MC, WL, AP ONLY)

## 2017-07-16 MED ORDER — TRAMADOL HCL 50 MG PO TABS: 50.0000 mg | ORAL_TABLET | Freq: Four times a day (QID) | ORAL | 0 refills | Status: AC | PRN

## 2017-07-16 MED ORDER — DEXTROSE 5 % IV SOLN
1.0000 g | Freq: Once | INTRAVENOUS | Status: AC
  Administered 2017-07-16: 1 g via INTRAVENOUS
  Filled 2017-07-16: qty 10

## 2017-07-16 MED ORDER — TRAMADOL HCL 50 MG PO TABS
50.0000 mg | ORAL_TABLET | Freq: Once | ORAL | Status: DC
  Filled 2017-07-16: qty 1

## 2017-07-16 MED ORDER — HYDROMORPHONE HCL 1 MG/ML IJ SOLN
1.0000 mg | Freq: Once | INTRAMUSCULAR | Status: AC
  Administered 2017-07-16: 1 mg via INTRAVENOUS
  Filled 2017-07-16: qty 1

## 2017-07-16 MED ORDER — AZITHROMYCIN 250 MG PO TABS
1000.0000 mg | ORAL_TABLET | Freq: Once | ORAL | Status: AC
  Administered 2017-07-16: 1000 mg via ORAL
  Filled 2017-07-16: qty 4

## 2017-07-16 MED ORDER — ONDANSETRON HCL 4 MG/2ML IJ SOLN
4.0000 mg | Freq: Once | INTRAMUSCULAR | Status: AC
  Administered 2017-07-16: 4 mg via INTRAVENOUS
  Filled 2017-07-16: qty 2

## 2017-07-16 NOTE — ED Notes (Signed)
Patient transported to CT 

## 2017-07-16 NOTE — ED Notes (Signed)
Pt got to [place] and refused due to pain, pt encouraged to continue with test and still refused. edp notified and pt brought back to exam room.

## 2017-07-16 NOTE — ED Notes (Signed)
Pt assisted to restroom and and back to room. Pt ambulated with a steady gait and had no complaints.

## 2017-07-16 NOTE — ED Notes (Signed)
Patient transported to Ultrasound 

## 2017-07-16 NOTE — ED Triage Notes (Signed)
Pt c/o left sided abdominal pain with n/v that began "a few hours" PTA. Denies urinary symptoms.

## 2017-07-16 NOTE — ED Provider Notes (Signed)
[facility]-EMERGENCY DEPT Provider Note   CSN: [id number] Arrival date & time: 07/16/17  0132     History   Chief Complaint Chief Complaint  Patient presents with  . Abdominal Pain    HPI Shelby Adkins is a 26 y.o. female.  Patient presents to the ER for evaluation of left lower abdominal and pelvic pain.patient reports onset of pain several hours ago. She has had associated nausea and vomiting. She denies any vaginal discharge, unusual vaginal bleeding. She has not had any diarrhea or constipation. No rectal bleeding. She denies urinary symptoms.      Past Medical History:  Diagnosis Date  . Chronic back pain     There are no active problems to display for this patient.   Past Surgical History:  Procedure Laterality Date  . BACK SURGERY     rod placement.    OB History    No data available       Home Medications    Prior to Admission medications   Not on File    Family History No family history on file.  Social History Social History  Substance Use Topics  . Smoking status: Current Every Day Smoker    Packs/day: 0.50  . Smokeless tobacco: Never Used  . Alcohol use Yes     Comment: occasionally     Allergies   Penicillins; Aspirin; Ibuprofen; and Latex   Review of Systems Review of Systems  Gastrointestinal: Positive for abdominal pain.  Genitourinary: Positive for pelvic pain.  All other systems reviewed and are negative.    Physical Exam Updated Vital Signs BP (!) 93/48   Pulse (!) 45   Temp 98.6 F (37 C) (Oral)   Resp 18   Ht 5\' 6"  (1.676 m)   Wt 68 kg (150 lb)   SpO2 94%   BMI 24.21 kg/m   Physical Exam  Constitutional: She is oriented to person, place, and time. She appears well-developed and well-nourished. No distress.  HENT:  Head: Normocephalic and atraumatic.  Right Ear: Hearing normal.  Left Ear: Hearing normal.  Nose: Nose normal.  Mouth/Throat: Oropharynx is clear and moist and mucous membranes are normal.  Eyes:  Pupils are equal, round, and reactive to light. Conjunctivae and EOM are normal.  Neck: Normal range of motion. Neck supple.  Cardiovascular: Regular rhythm, S1 normal and S2 normal.  Exam reveals no gallop and no friction rub.   No murmur heard. Pulmonary/Chest: Effort normal and breath sounds normal. No respiratory distress. She exhibits no tenderness.  Abdominal: Soft. Normal appearance and bowel sounds are normal. There is no hepatosplenomegaly. There is tenderness in the left lower quadrant. There is no rebound, no guarding, no tenderness at McBurney's point and negative Murphy's sign. No hernia.  Genitourinary: Vagina normal and uterus normal. Cervix exhibits motion tenderness. Cervix exhibits no discharge and no friability. Right adnexum displays no mass and no tenderness. Left adnexum displays tenderness. Left adnexum displays no mass.  Musculoskeletal: Normal range of motion.  Neurological: She is alert and oriented to person, place, and time. She has normal strength. No cranial nerve deficit or sensory deficit. Coordination normal. GCS eye subscore is 4. GCS verbal subscore is 5. GCS motor subscore is 6.  Skin: Skin is warm, dry and intact. No rash noted. No cyanosis.  Psychiatric: She has a normal mood and affect. Her speech is normal and behavior is normal. Thought content normal.  Nursing note and vitals reviewed.    ED Treatments / Results  Labs (  all labs ordered are listed, but only abnormal results are displayed) Labs Reviewed  COMPREHENSIVE METABOLIC PANEL - Abnormal; Notable for the following:       Result Value   Glucose, Bld 112 (*)    Calcium 8.3 (*)    Total Protein 5.5 (*)    Albumin 3.4 (*)    ALT 10 (*)    All other components within normal limits  URINALYSIS, ROUTINE W REFLEX MICROSCOPIC - Abnormal; Notable for the following:    APPearance HAZY (*)    Protein, ur 30 (*)    Leukocytes, UA TRACE (*)    Bacteria, UA RARE (*)    Squamous Epithelial / LPF 0-5 (*)     All other components within normal limits  WET PREP, GENITAL  LIPASE, BLOOD  CBC  I-STAT BETA HCG BLOOD, ED ([facility], [facility], [facility] ONLY)  GC/CHLAMYDIA PROBE AMP ([place]) NOT AT [facility]    EKG  EKG Interpretation None       Radiology Ct Renal Stone Study  Result Date: 07/16/2017 CLINICAL DATA:  Left-sided abdominal pain EXAM: CT ABDOMEN AND PELVIS WITHOUT CONTRAST TECHNIQUE: Multidetector CT imaging of the abdomen and pelvis was performed following the standard protocol without IV contrast. COMPARISON:  None. FINDINGS: Lower chest: No pulmonary nodules or pleural effusion. No visible pericardial effusion. Hepatobiliary: Normal hepatic contours and density. No visible biliary dilatation. Status post cholecystectomy. Pancreas: Normal contours without ductal dilatation. No peripancreatic fluid collection. Spleen: Normal. Adrenals/Urinary Tract: --Adrenal glands: Normal. --Right kidney/ureter: No hydronephrosis or perinephric stranding. No nephrolithiasis. No obstructing ureteral stones. --Left kidney/ureter: No hydronephrosis or perinephric stranding. No nephrolithiasis. No obstructing ureteral stones. --Urinary bladder: Unremarkable. Stomach/Bowel: --Stomach/Duodenum: No hiatal hernia or other gastric abnormality. Normal duodenal course and caliber. --Small bowel: No dilatation or inflammation. --Colon: No focal abnormality. --Appendix: Normal. Vascular/Lymphatic: Normal course and caliber of the major abdominal vessels. No abdominal or pelvic lymphadenopathy. Reproductive: Intrauterine contraceptive device. Normal uterus and ovaries. Musculoskeletal. T12-L2 posterior spinal fusion. Unchanged old L1 compression fracture. Other: None. IMPRESSION: No obstructive uropathy or other acute abdominopelvic abnormality. Electronically Signed   By: Deatra Robinson M.D.   On: 07/16/2017 06:46    Procedures Procedures (including critical care time)  Medications Ordered in ED Medications  cefTRIAXone (ROCEPHIN) 1  g in dextrose 5 % 50 mL IVPB (not administered)  azithromycin (ZITHROMAX) tablet 1,000 mg (not administered)  HYDROmorphone (DILAUDID) injection 1 mg (1 mg Intravenous Given 07/16/17 0454)  ondansetron (ZOFRAN) injection 4 mg (4 mg Intravenous Given 07/16/17 0452)     Initial Impression / Assessment and Plan / ED Course  I have reviewed the triage vital signs and the nursing notes.  Pertinent labs & imaging results that were available during my care of the patient were reviewed by me and considered in my medical decision making (see chart for details).     Patient presented with severe left lower abdominal pain, nausea and vomiting. Blood work was normal. Urinalysis did show microscopic hematuria. She is not menstruating. Renal colic was considered. Renal stone CAT scan, however, was unremarkable. Pelvic exam was performed. She does have tenderness but no mass in the left adnexal region. She had slight cervical motion tenderness. Will cover for STD, also will send for ultrasound to rule out torsion and tubo-ovarian abscess. Will sign out to oncoming ER physician with ultrasound pending. Anticipate discharge and less there is a surgical process identified on ultrasound.  Final Clinical Impressions(s) / ED Diagnoses   Final diagnoses:  Female pelvic  pain  Cervicitis    New Prescriptions New Prescriptions   No medications on file     Gilda Crease, MD 07/16/17 [phone]

## 2017-07-16 NOTE — ED Provider Notes (Signed)
I received this patient in signout from Dr. Blinda Leatherwood. We were awaiting pelvic US to r/o ovarian torsion or other acute pathology. I was later notified by the nurse that the patient refused to allow the ultrasound when she got to the ultrasound department because of her pain. She was brought back to her ED room. I ordered pain medication but the patient later refused stating that she wanted to check out. I discussed with her risks of leaving including worsening condition, permanent disability including loss of ovary, or even death. Recommended returning to ER or to primary care provider as soon as possible to have ultrasound performed. Patient voiced understanding and signed out AGAINST MEDICAL ADVICE.   Shelby Adkins, Ambrose Finland, MD 07/16/17 1002

## 2017-07-16 NOTE — ED Notes (Signed)
Pt refused pain medication and would like to check herself out. edp notified

## 2017-07-17 LAB — GC/CHLAMYDIA PROBE AMP (~~LOC~~) NOT AT ARMC
CHLAMYDIA, DNA PROBE: NEGATIVE
Neisseria Gonorrhea: NEGATIVE

## 2017-12-18 ENCOUNTER — Emergency Department (HOSPITAL_COMMUNITY)
Admission: EM | Admit: 2017-12-18 | Discharge: 2017-12-18 | Disposition: A | Discharge status: Home / Self Care | Payer: Self-pay

## 2017-12-18 NOTE — ED Notes (Signed)
Pt called to triage with no answer  

## 2020-07-27 ENCOUNTER — Other Ambulatory Visit: Payer: Self-pay

## 2020-07-27 ENCOUNTER — Emergency Department (HOSPITAL_COMMUNITY): Payer: Self-pay

## 2020-07-27 ENCOUNTER — Encounter (HOSPITAL_COMMUNITY): Payer: Self-pay | Admitting: *Deleted

## 2020-07-27 ENCOUNTER — Emergency Department (HOSPITAL_COMMUNITY)
Admission: EM | Admit: 2020-07-27 | Discharge: 2020-07-28 | Disposition: A | Discharge status: Home / Self Care | Payer: Self-pay | Attending: Emergency Medicine | Admitting: Emergency Medicine

## 2020-07-27 DIAGNOSIS — R0789 Other chest pain: Secondary | ICD-10-CM | POA: Insufficient documentation

## 2020-07-27 DIAGNOSIS — Z5321 Procedure and treatment not carried out due to patient leaving prior to being seen by health care provider: Secondary | ICD-10-CM | POA: Insufficient documentation

## 2020-07-27 LAB — BASIC METABOLIC PANEL
Anion gap: 8 (ref 5–15)
BUN: 6 mg/dL (ref 6–20)
CO2: 25 mmol/L (ref 22–32)
Calcium: 8.7 mg/dL — ABNORMAL LOW (ref 8.9–10.3)
Chloride: 105 mmol/L (ref 98–111)
Creatinine, Ser: 0.83 mg/dL (ref 0.44–1.00)
GFR calc Af Amer: >60 mL/min (ref >60)
GFR calc non Af Amer: >60 mL/min (ref >60)
Glucose, Bld: 145 mg/dL — ABNORMAL HIGH (ref 70–99)
Potassium: 3.3 mmol/L — ABNORMAL LOW (ref 3.5–5.1)
Sodium: 138 mmol/L (ref 135–145)

## 2020-07-27 LAB — CBC
HCT: 35.4 % — ABNORMAL LOW (ref 36.0–46.0)
Hemoglobin: 11 g/dL — ABNORMAL LOW (ref 12.0–15.0)
MCH: 29.6 pg (ref 26.0–34.0)
MCHC: 31.1 g/dL (ref 30.0–36.0)
MCV: 95.4 fL (ref 80.0–100.0)
Platelets: 176 10*3/uL (ref 150–400)
RBC: 3.71 MIL/uL — ABNORMAL LOW (ref 3.87–5.11)
RDW: 13.9 % (ref 11.5–15.5)
WBC: 7.3 10*3/uL (ref 4.0–10.5)
nRBC: 0 % (ref 0.0–0.2)

## 2020-07-27 LAB — I-STAT BETA HCG BLOOD, ED (MC, WL, AP ONLY): I-stat hCG, quantitative: <5 m[IU]/mL (ref <5)

## 2020-07-27 LAB — TROPONIN I (HIGH SENSITIVITY): Troponin I (High Sensitivity): <2 ng/L (ref <18)

## 2020-07-27 NOTE — ED Triage Notes (Signed)
Pt arrived by [facility] for chest wall pain, increases with movement and palpation. Reports iv drug use last night. No distress noted at triage.

## 2020-07-27 NOTE — ED Notes (Signed)
Pt called for labs and vitals x3 no answer

## 2020-09-08 ENCOUNTER — Other Ambulatory Visit: Payer: Self-pay

## 2020-09-08 ENCOUNTER — Observation Stay (HOSPITAL_COMMUNITY)
Admission: EM | Admit: 2020-09-08 | Discharge: 2020-09-08 | Disposition: A | Discharge status: Home / Self Care | Payer: Self-pay | Attending: Family Medicine | Admitting: Family Medicine

## 2020-09-08 ENCOUNTER — Emergency Department (HOSPITAL_COMMUNITY): Payer: Self-pay

## 2020-09-08 ENCOUNTER — Encounter (HOSPITAL_COMMUNITY): Payer: Self-pay | Admitting: *Deleted

## 2020-09-08 DIAGNOSIS — R569 Unspecified convulsions: Principal | ICD-10-CM | POA: Insufficient documentation

## 2020-09-08 DIAGNOSIS — G8929 Other chronic pain: Secondary | ICD-10-CM | POA: Insufficient documentation

## 2020-09-08 DIAGNOSIS — Z91040 Latex allergy status: Secondary | ICD-10-CM | POA: Insufficient documentation

## 2020-09-08 DIAGNOSIS — F17210 Nicotine dependence, cigarettes, uncomplicated: Secondary | ICD-10-CM | POA: Insufficient documentation

## 2020-09-08 DIAGNOSIS — F445 Conversion disorder with seizures or convulsions: Secondary | ICD-10-CM | POA: Diagnosis present

## 2020-09-08 DIAGNOSIS — D72829 Elevated white blood cell count, unspecified: Secondary | ICD-10-CM | POA: Insufficient documentation

## 2020-09-08 DIAGNOSIS — Z20822 Contact with and (suspected) exposure to covid-19: Secondary | ICD-10-CM | POA: Insufficient documentation

## 2020-09-08 HISTORY — DX: Unspecified convulsions: R56.9

## 2020-09-08 LAB — CBC
HCT: 43.5 % (ref 36.0–46.0)
Hemoglobin: 14 g/dL (ref 12.0–15.0)
MCH: 29.9 pg (ref 26.0–34.0)
MCHC: 32.2 g/dL (ref 30.0–36.0)
MCV: 92.9 fL (ref 80.0–100.0)
Platelets: 231 10*3/uL (ref 150–400)
RBC: 4.68 MIL/uL (ref 3.87–5.11)
RDW: 14.3 % (ref 11.5–15.5)
WBC: 11 10*3/uL — ABNORMAL HIGH (ref 4.0–10.5)
nRBC: 0 % (ref 0.0–0.2)

## 2020-09-08 LAB — RESPIRATORY PANEL BY RT PCR (FLU A&B, COVID)
Influenza A by PCR: NEGATIVE
Influenza B by PCR: NEGATIVE
SARS Coronavirus 2 by RT PCR: NEGATIVE

## 2020-09-08 LAB — URINALYSIS, ROUTINE W REFLEX MICROSCOPIC
Bacteria, UA: NONE SEEN
Bilirubin Urine: NEGATIVE
Glucose, UA: NEGATIVE mg/dL
Ketones, ur: NEGATIVE mg/dL
Leukocytes,Ua: NEGATIVE
Nitrite: NEGATIVE
Protein, ur: NEGATIVE mg/dL
Specific Gravity, Urine: 1.009 (ref 1.005–1.030)
pH: 8 (ref 5.0–8.0)

## 2020-09-08 LAB — COMPREHENSIVE METABOLIC PANEL
ALT: 11 U/L (ref 0–44)
AST: 17 U/L (ref 15–41)
Albumin: 4 g/dL (ref 3.5–5.0)
Alkaline Phosphatase: 75 U/L (ref 38–126)
Anion gap: 8 (ref 5–15)
BUN: 14 mg/dL (ref 6–20)
CO2: 26 mmol/L (ref 22–32)
Calcium: 9.4 mg/dL (ref 8.9–10.3)
Chloride: 105 mmol/L (ref 98–111)
Creatinine, Ser: 0.7 mg/dL (ref 0.44–1.00)
GFR, Estimated: >60 mL/min (ref >60)
Glucose, Bld: 93 mg/dL (ref 70–99)
Potassium: 4.2 mmol/L (ref 3.5–5.1)
Sodium: 139 mmol/L (ref 135–145)
Total Bilirubin: 0.4 mg/dL (ref 0.3–1.2)
Total Protein: 7.7 g/dL (ref 6.5–8.1)

## 2020-09-08 LAB — RAPID URINE DRUG SCREEN, HOSP PERFORMED
Amphetamines: NOT DETECTED
Barbiturates: NOT DETECTED
Benzodiazepines: POSITIVE — AB
Cocaine: NOT DETECTED
Opiates: NOT DETECTED
Tetrahydrocannabinol: NOT DETECTED

## 2020-09-08 LAB — CBG MONITORING, ED: Glucose-Capillary: 96 mg/dL (ref 70–99)

## 2020-09-08 LAB — ETHANOL: Alcohol, Ethyl (B): <10 mg/dL (ref <10)

## 2020-09-08 LAB — POC URINE PREG, ED: Preg Test, Ur: NEGATIVE

## 2020-09-08 MED ORDER — ENOXAPARIN SODIUM 40 MG/0.4ML ~~LOC~~ SOLN: 40.0000 mg | SUBCUTANEOUS | Status: DC

## 2020-09-08 MED ORDER — DIVALPROEX SODIUM 250 MG PO DR TAB
1000.0000 mg | DELAYED_RELEASE_TABLET | Freq: Once | ORAL | Status: AC
  Administered 2020-09-08: 1000 mg via ORAL
  Filled 2020-09-08: qty 4

## 2020-09-08 MED ORDER — ACETAMINOPHEN 325 MG PO TABS
650.0000 mg | ORAL_TABLET | Freq: Once | ORAL | Status: AC
  Administered 2020-09-08: 650 mg via ORAL
  Filled 2020-09-08: qty 2

## 2020-09-08 MED ORDER — ACETAMINOPHEN 325 MG PO TABS: 650.0000 mg | ORAL_TABLET | Freq: Four times a day (QID) | ORAL | Status: DC | PRN

## 2020-09-08 MED ORDER — LORAZEPAM 2 MG/ML IJ SOLN: 2.0000 mg | Freq: Once | INTRAMUSCULAR | Status: AC

## 2020-09-08 MED ORDER — DIVALPROEX SODIUM 250 MG PO DR TAB: 500.0000 mg | DELAYED_RELEASE_TABLET | Freq: Three times a day (TID) | ORAL | Status: DC

## 2020-09-08 MED ORDER — LORAZEPAM 2 MG/ML IJ SOLN
INTRAMUSCULAR | Status: AC
  Administered 2020-09-08: 2 mg via INTRAVENOUS
  Filled 2020-09-08: qty 1

## 2020-09-08 MED ORDER — ONDANSETRON HCL 4 MG PO TABS: 4.0000 mg | ORAL_TABLET | Freq: Four times a day (QID) | ORAL | Status: DC | PRN

## 2020-09-08 MED ORDER — LORAZEPAM 2 MG/ML IJ SOLN: 0.5000 mg | Freq: Four times a day (QID) | INTRAMUSCULAR | Status: DC | PRN

## 2020-09-08 MED ORDER — LORAZEPAM 2 MG/ML IJ SOLN: 2.0000 mg | INTRAMUSCULAR | Status: DC | PRN

## 2020-09-08 MED ORDER — ACETAMINOPHEN 650 MG RE SUPP: 650.0000 mg | Freq: Four times a day (QID) | RECTAL | Status: DC | PRN

## 2020-09-08 MED ORDER — ADULT MULTIVITAMIN W/MINERALS CH: 1.0000 | ORAL_TABLET | Freq: Every day | ORAL | Status: DC

## 2020-09-08 MED ORDER — ONDANSETRON HCL 4 MG/2ML IJ SOLN
4.0000 mg | Freq: Four times a day (QID) | INTRAMUSCULAR | Status: DC | PRN
  Administered 2020-09-08: 4 mg via INTRAVENOUS
  Filled 2020-09-08: qty 2

## 2020-09-08 MED ORDER — LORAZEPAM 2 MG/ML IJ SOLN
0.5000 mg | INTRAMUSCULAR | Status: DC | PRN
  Administered 2020-09-08: 0.5 mg via INTRAVENOUS
  Filled 2020-09-08: qty 1

## 2020-09-08 MED ORDER — OXYCODONE HCL 5 MG PO TABS: 5.0000 mg | ORAL_TABLET | ORAL | Status: DC | PRN

## 2020-09-08 NOTE — ED Notes (Signed)
Pt leaving AMA with her belongings at this time

## 2020-09-08 NOTE — ED Notes (Signed)
Pt alert and oriented.

## 2020-09-08 NOTE — ED Provider Notes (Signed)
[facility] EMERGENCY DEPARTMENT Provider Note   CSN: [id number] Arrival date & time: 09/08/20  1422     History Chief Complaint  Patient presents with  . Seizures    Shelby Adkins is a 29 y.o. female.  HPI   Patient presents to the ED for evaluation of possible seizures.  Patient states she has a history of seizure disorder.  She has not been on medications for a while.  Patient is currently incarcerated.  According to the EMS report she started having seizure-like activity while in jail.  They called [facility].  They felt the patient was having active seizure-like activity as well and she was given 2 mg of Versed nasally and then IM.  Patient presented to the ED with persistent seizure-like activity.  While in the ED after sternal rub the seizure activity stopped.  Patient was alert and awake and able to answer questions.  No postictal period.  Patient told me that her seizures are often brought on by stress.  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  She is having some discomfort in her chest.  She is not have any fevers or chills.  No injuries.  Past Medical History:  Diagnosis Date  . Chronic back pain   . Seizures (HCC)     There are no problems to display for this patient.   Past Surgical History:  Procedure Laterality Date  . BACK SURGERY     rod placement.     OB History   No obstetric history on file.     No family history on file.  Social History   Tobacco Use  . Smoking status: Current Every Day Smoker    Packs/day: 0.50  . Smokeless tobacco: Never Used  Substance Use Topics  . Alcohol use: Yes    Comment: occasionally  . Drug use: No    Home Medications Prior to Admission medications   Medication Sig Start Date End Date Taking? Authorizing Provider  traMADol (ULTRAM) 50 MG tablet Take 1 tablet (50 mg total) by mouth every 6 (six) hours as needed. 07/16/17   Gilda Crease, MD     Allergies    Penicillins, Aspirin, Ibuprofen, and Latex  Review of Systems   Review of Systems  All other systems reviewed and are negative.   Physical Exam Updated Vital Signs BP (!) 143/91   Pulse 96   Temp 98.2 F (36.8 C) (Oral)   Resp 15   Ht 1.727 m (5\' 8" )   Wt 59 kg   SpO2 99%   BMI 19.77 kg/m   Physical Exam Vitals and nursing note reviewed.  Constitutional:      General: She is not in acute distress.    Appearance: She is well-developed.  HENT:     Head: Normocephalic and atraumatic.     Right Ear: External ear normal.     Left Ear: External ear normal.  Eyes:     General: No scleral icterus.       Right eye: No discharge.        Left eye: No discharge.     Conjunctiva/sclera: Conjunctivae normal.  Neck:     Trachea: No tracheal deviation.  Cardiovascular:     Rate and Rhythm: Normal rate and regular rhythm.  Pulmonary:     Effort: Pulmonary effort is normal. No respiratory distress.     Breath sounds: Normal breath sounds. No stridor. No wheezing or rales.  Abdominal:  General: Bowel sounds are normal. There is no distension.     Palpations: Abdomen is soft.     Tenderness: There is no abdominal tenderness. There is no guarding or rebound.  Musculoskeletal:        General: No tenderness.     Cervical back: Neck supple.  Skin:    General: Skin is warm and dry.     Findings: No rash.  Neurological:     Mental Status: She is alert.     Cranial Nerves: No cranial nerve deficit (no facial droop, extraocular movements intact, no slurred speech).     Sensory: No sensory deficit.     Motor: No abnormal muscle tone or seizure activity.     Coordination: Coordination normal.     ED Results / Procedures / Treatments   Labs (all labs ordered are listed, but only abnormal results are displayed) Labs Reviewed  CBC - Abnormal; Notable for the following components:      Result Value   WBC 11.0 (*)    All other components within normal limits   COMPREHENSIVE METABOLIC PANEL  ETHANOL  RAPID URINE DRUG SCREEN, HOSP PERFORMED  CBG MONITORING, ED  POC URINE PREG, ED    EKG EKG Interpretation  Date/Time:  Saturday September 08 2020 14:32:03 EDT Ventricular Rate:  82 PR Interval:    QRS Duration: 98 QT Interval:  370 QTC Calculation: 433 R Axis:   100 Text Interpretation: Sinus rhythm Borderline short PR interval Borderline right axis deviation Baseline wander in lead(s) V2 No significant change since last tracing Confirmed by Linwood Dibbles [phone]) on 09/08/2020 2:38:24 PM   Radiology CT Head Wo Contrast  Result Date: 09/08/2020 CLINICAL DATA:  Seizure EXAM: CT HEAD WITHOUT CONTRAST TECHNIQUE: Contiguous axial images were obtained from the base of the skull through the vertex without intravenous contrast. COMPARISON:  None. FINDINGS: Brain: No evidence of acute infarction, hemorrhage, hydrocephalus, extra-axial collection or mass lesion/mass effect. Vascular: No hyperdense vessel or unexpected calcification. Skull: Normal. Negative for fracture or focal lesion. Sinuses/Orbits: No acute finding. Other: None. IMPRESSION: No acute intracranial pathology. Electronically Signed   By: Lauralyn Primes M.D.   On: 09/08/2020 15:08    Procedures Procedures (including critical care time)  Medications Ordered in ED Medications  LORazepam (ATIVAN) injection 2 mg (2 mg Intravenous Given 09/08/20 1432)  LORazepam (ATIVAN) injection 2 mg (2 mg Intravenous Given 09/08/20 1521)  acetaminophen (TYLENOL) tablet 650 mg (650 mg Oral Given 09/08/20 1550)    ED Course  I have reviewed the triage vital signs and the nursing notes.  Pertinent labs & imaging results that were available during my care of the patient were reviewed by me and considered in my medical decision making (see chart for details).  Clinical Course as of Sep 08 1601  Sat Sep 08, 2020  1601 Labs and head CT findings reviewed.  No acute abnormalities   [JK]    Clinical Course  User Index [JK] Linwood Dibbles, MD   MDM Rules/Calculators/A&P                          Patient presents to ED for for concerns of possible seizures.  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  Patient states her seizures are brought on by stress.  Patient's ED work-up was otherwise reassuring.  Labs are normal.  UDS and urine hCG are pending.  Head CT did  not show any acute findings.  Patient has had recurrent and repetitive episodes of seizure-like activity while she has been in the ED.  These episodes are suggestive of nonepileptic activity as she is not having any post ictal.  During these episodes.  Plan is to consult with teleneurology.  Care transferred to Dr. Renaye Rakers Final Clinical Impression(s) / ED Diagnoses Final diagnoses:  Seizure-like activity [facility])      Linwood Dibbles, MD 09/09/20 [phone]

## 2020-09-08 NOTE — ED Notes (Signed)
Pt having episodes of body shaking, sternal applied and pt came out of it and looked at me and started again . Lasted approx 1 min

## 2020-09-08 NOTE — ED Notes (Signed)
Pt sitting and watching TV at this time.

## 2020-09-08 NOTE — ED Notes (Signed)
Per EDP only do one hour of neuros

## 2020-09-08 NOTE — ED Notes (Signed)
Pt stated she wanted to sit on toilet, pt assisted to wheelchair, when pt was in restroom she attempted to fall out in bathroom floor, this RN assisted back to wheelchair, pt then wheeled back to room, pt then started to have seizure like activity, vitals WDL during aprox 2 min episode, see MAR for intervention, pt A&O after episode, seizure pads on stretcher, ambu bag, O2 & suction set up at bedside but not needed.

## 2020-09-08 NOTE — ED Provider Notes (Signed)
Pt signed out ot me by Dr Lynelle Doctor.  Briefly 29 yo female presenting from prison in police custody with concern for seizures.  Patient arrived with shaking activity, that abruptly ended with physical stimulation.  No post-ictal state.  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  She was on tegretol and depakote last year but "ran out of meds."  She says that stress is a "trigger" for her.  She has now been in custody for about 2 weeks.  Today labs unremarkable.  CTH negative.    UDS and Upreg pending.    Plan for neurology consult to assist with decision for EEG/ disposition planning.  She is in police custody and would be discharged back to jail.  415 pm - teleneurology consult placed.  Pt awake now, mental status normal, answering questions appropriately.  4:57 pm - called into room as patient was having more shaking episodes.  This is generalized arms and legs shaking with eyes rolling up in head, but abruptly ends and she awakens immediately, able to converse [employer] I had another one.")  Occasionally has a single jerk or eye-rolling for 1 second, then resolves.  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  Neuro consult is pending.  8 pm - signed out to hospitalist with plan for transfer for EEG (Not available at [facility] over the weekend).  Neurology recommended 1000 mg PO depakote and then 500 mg TID, as well as an EEG.  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  He recommended supportive care, avoid benzos unless pt seizing > 5 minutes or repetitive seizures in 30 minutes, then consider low-dose benzos (0.5 mg) as needed.   Terald Sleeper, MD 09/08/20 [phone]

## 2020-09-08 NOTE — ED Notes (Signed)
Verbal order from Dr knapp to only do neuros every 15 mins for one hour

## 2020-09-08 NOTE — ED Triage Notes (Signed)
Pt brought to ED via [facility] emergency traffic actively seizing, pt given 2mg  versed nasally and additional 2 mg versed im with intermittent response of seizure type activity,

## 2020-09-08 NOTE — ED Notes (Signed)
Pt placed on purewick 

## 2020-09-08 NOTE — ED Notes (Signed)
Pt in waiting room calling stating she did not receive all of her belongings back from [facility], PD working with discharged pt at this time about issue.

## 2020-09-08 NOTE — H&P (Signed)
TRH H&P    Patient Demographics:    Shelby Adkins, is a 29 y.o. female  MRN: [id number]  DOB - Oct 12, 1991  Admit Date - 09/08/2020  Referring MD/NP/PA: Renaye Rakers  Outpatient Primary MD for the patient is Patient, No Pcp Per  Patient coming from: home  Chief complaint- seizure   HPI:    Shelby Adkins  is a 29 y.o. female, with history of seizures, opiate abuse, and chronic back pain presents to the ER today from prison for seizure activity.  Patient reports that she has been in custody for the last 10 days.  10 days ago she started having seizure like episodes 3 times daily.  Today she had 5 of these episodes.  She reports that they can last from a couple seconds to a couple minutes.  She reports that she notices shaking, then she blacks out, then the next thing she is aware of is the nurse approaching her at the prison.  Patient reports that she has had episodes of biting her tongue resulting in bleeding, biting her lip resulting in bleeding, and urinary incontinence.  Patient did have one episode in our ER.  Nurse reports that whole body was shaking, eyes rolled back and head, urinary incontinence, but immediately awake with sternal rub.  No postictal state.  Patient believes coming off of heroin has incited these episodes.  Patient had been prescribed Depakote 2 years ago when she was in the [place] prison.  Patient reports that she has not been able to get Depakote for the last year because of financial barriers.  Patient has no other complaints at this time.  In the ED Temperature 98.2, heart rate 72, respiratory rate 19, blood pressure 114/68, 100% room air Leukocytosis with white blood cell count at 11 CHEM panel is unremarkable Urine drug screen is positive for benzos Alcohol level is less than 10 CT head shows no acute intracranial pathology EKG shows a heart rate of 82, sinus rhythm, QTC 433 Patient was  given Tylenol, Depakote, Ativan 2 mg doses x2 Neuro was consulted: Recommended loading with Depakote 1 g, continuing Depakote at 500 mg 3 times daily, recommended 20-minute EEG, recommended not treating generalized shaking spells unless they are lasting longer than 5 minutes, or hemodynamically unstable, or she has multiple episodes within 1 hour. Advised to give 0.5 mg Ativan for anxiety    Review of systems:    In addition to the HPI above,  No Fever-chills, No Headache, No changes with Vision or hearing, No problems swallowing food or Liquids, No Chest pain, Cough or Shortness of Breath, No Abdominal pain, does admit to nausea and vomiting, bowel movements are regular, No Blood in stool or Urine, No dysuria, No new skin rashes or bruises, No new joints pains-aches,  No new weakness, tingling, numbness in any extremity, No recent weight gain or loss, No polyuria, polydypsia or polyphagia, Significant mental stressor-patient in prison for last 10 days  All other systems reviewed and are negative.    Past History of the following :  Past Medical History:  Diagnosis Date  . Chronic back pain   . Seizures (HCC)       Past Surgical History:  Procedure Laterality Date  . BACK SURGERY     rod placement.      Social History:      Social History   Tobacco Use  . Smoking status: Current Every Day Smoker    Packs/day: 0.50  . Smokeless tobacco: Never Used  Substance Use Topics  . Alcohol use: Yes    Comment: occasionally       Family History :    No family history on file. Family history of hypertension   Home Medications:   Prior to Admission medications   Medication Sig Start Date End Date Taking? Authorizing Provider  traMADol (ULTRAM) 50 MG tablet Take 1 tablet (50 mg total) by mouth every 6 (six) hours as needed. 07/16/17   Gilda Crease, MD     Allergies:     Allergies  Allergen Reactions  . Penicillins Hives  . Aspirin Itching and  Rash  . Ibuprofen Itching and Rash  . Latex Rash     Physical Exam:   Vitals  Blood pressure 114/68, pulse 72, temperature 98.2 F (36.8 C), temperature source Oral, resp. rate 19, height  (1.727 m), weight 59 kg, SpO2 100 %.  1.  General: Sitting up in bed with left arm and leg handcuffed to rail  2. Psychiatric: Mood and behavior normal for situation  3. Neurologic: Slow speech likely due to Ativan doses given in the ED Cranial nerves II through XII intact, moves all 4 extremities voluntarily, no focal deficit on limited exam  4. HEENMT:  Head is atraumatic, normocephalic, no bites observed on tongue or lip, pupils reactive, neck is supple, mucous membranes moist, trachea is midline  5. Respiratory : Lungs are clear to auscultation bilaterally  6. Cardiovascular : Heart rate and rhythm are regular no murmurs rubs or gallops  7. Gastrointestinal:  Abdomen is soft, nondistended, nontender  8. Skin:  No acute lesions on limited skin exam  9.Musculoskeletal:  No peripheral edema or acute deformity    Data Review:    CBC Recent Labs  Lab 09/08/20 1429  WBC 11.0*  HGB 14.0  HCT 43.5  PLT 231  MCV 92.9  MCH 29.9  MCHC 32.2  RDW 14.3   ------------------------------------------------------------------------------------------------------------------  Results for orders placed or performed during the hospital encounter of 09/08/20 (from the past 48 hour(s))  Comprehensive metabolic panel     Status: None   Collection Time: 09/08/20  2:29 PM  Result Value Ref Range   Sodium 139 135 - 145 mmol/L   Potassium 4.2 3.5 - 5.1 mmol/L   Chloride 105 98 - 111 mmol/L   CO2 26 22 - 32 mmol/L   Glucose, Bld 93 70 - 99 mg/dL    Comment: Glucose reference range applies only to samples taken after fasting for at least 8 hours.   BUN 14 6 - 20 mg/dL   Creatinine, Ser 1.61 0.44 - 1.00 mg/dL   Calcium 9.4 8.9 - 09.6 mg/dL   Total Protein 7.7 6.5 - 8.1 g/dL   Albumin  4.0 3.5 - 5.0 g/dL   AST 17 15 - 41 U/L   ALT 11 0 - 44 U/L   Alkaline Phosphatase 75 38 - 126 U/L   Total Bilirubin 0.4 0.3 - 1.2 mg/dL   GFR, Estimated >04 >54 mL/min   Anion gap 8 5 -  15    Comment: Performed at [facility], [address] [place], [place] [address]  CBC     Status: Abnormal   Collection Time: 09/08/20  2:29 PM  Result Value Ref Range   WBC 11.0 (H) 4.0 - 10.5 K/uL   RBC 4.68 3.87 - 5.11 MIL/uL   Hemoglobin 14.0 12.0 - 15.0 g/dL   HCT 15.1 36 - 46 %   MCV 92.9 80.0 - 100.0 fL   MCH 29.9 26.0 - 34.0 pg   MCHC 32.2 30.0 - 36.0 g/dL   RDW 76.1 60.7 - 37.1 %   Platelets 231 150 - 400 K/uL   nRBC 0.0 0.0 - 0.2 %    Comment: Performed at [facility], [address], [place], [place] [address]  Ethanol     Status: None   Collection Time: 09/08/20  2:29 PM  Result Value Ref Range   Alcohol, Ethyl (B) <10 <10 mg/dL    Comment: (NOTE) Lowest detectable limit for serum alcohol is 10 mg/dL.  For medical purposes only. Performed at [facility], [address], [place], [place] [address]   Rapid urine drug screen (hospital performed)     Status: Abnormal   Collection Time: 09/08/20  2:29 PM  Result Value Ref Range   Opiates NONE DETECTED NONE DETECTED   Cocaine NONE DETECTED NONE DETECTED   Benzodiazepines POSITIVE (A) NONE DETECTED   Amphetamines NONE DETECTED NONE DETECTED   Tetrahydrocannabinol NONE DETECTED NONE DETECTED   Barbiturates NONE DETECTED NONE DETECTED    Comment: (NOTE) DRUG SCREEN FOR MEDICAL PURPOSES ONLY.  IF CONFIRMATION IS NEEDED FOR ANY PURPOSE, NOTIFY LAB WITHIN 5 DAYS.  LOWEST DETECTABLE LIMITS FOR URINE DRUG SCREEN Drug Class                     Cutoff (ng/mL) Amphetamine and metabolites    1000 Barbiturate and metabolites    200 Benzodiazepine                 200 Tricyclics and metabolites     300 Opiates and metabolites        300 Cocaine and metabolites        300 THC                            50 Performed at [facility], [address] [place], [place] [address]   CBG monitoring, ED     Status: None   Collection Time: 09/08/20  2:45 PM  Result Value Ref Range   Glucose-Capillary 96 70 - 99 mg/dL    Comment: Glucose reference range applies only to samples taken after fasting for at least 8 hours.  Urinalysis, Routine w reflex microscopic Urine, Random     Status: Abnormal   Collection Time: 09/08/20  4:18 PM  Result Value Ref Range   Color, Urine STRAW (A) YELLOW   APPearance CLEAR CLEAR   Specific Gravity, Urine 1.009 1.005 - 1.030   pH 8.0 5.0 - 8.0   Glucose, UA NEGATIVE NEGATIVE mg/dL   Hgb urine dipstick MODERATE (A) NEGATIVE   Bilirubin Urine NEGATIVE NEGATIVE   Ketones, ur NEGATIVE NEGATIVE mg/dL   Protein, ur NEGATIVE NEGATIVE mg/dL   Nitrite NEGATIVE NEGATIVE   Leukocytes,Ua NEGATIVE NEGATIVE   RBC / HPF 0-5 0 - 5 RBC/hpf   WBC, UA 0-5 0 - 5 WBC/hpf   Bacteria, UA NONE SEEN NONE SEEN  Squamous Epithelial / LPF 0-5 0 - 5    Comment: Performed at [facility], [address] [place], [place] [address]  POC urine preg, ED     Status: None   Collection Time: 09/08/20  4:47 PM  Result Value Ref Range   Preg Test, Ur NEGATIVE NEGATIVE    Comment:        THE SENSITIVITY OF THIS METHODOLOGY IS >24 mIU/mL   Respiratory Panel by RT PCR (Flu A&B, Covid) - Nasopharyngeal Swab     Status: None   Collection Time: 09/08/20  7:49 PM   Specimen: Nasopharyngeal Swab  Result Value Ref Range   SARS Coronavirus 2 by RT PCR NEGATIVE NEGATIVE    Comment: (NOTE) SARS-CoV-2 target nucleic acids are NOT DETECTED.  The SARS-CoV-2 RNA is generally detectable in upper respiratoy specimens during the acute phase of infection. The lowest concentration of SARS-CoV-2 viral copies this assay can detect is 131 copies/mL. A negative result does not preclude SARS-Cov-2 infection and should not be used as the sole basis for treatment or other patient management decisions. A negative result may occur with   improper specimen collection/handling, submission of specimen other than nasopharyngeal swab, presence of viral mutation(s) within the areas targeted by this assay, and inadequate number of viral copies (<131 copies/mL). A negative result must be combined with clinical observations, patient history, and epidemiological information. The expected result is Negative.  Fact Sheet for Patients:  https://www.moore.com/https://www.fda.gov/media/142436/download  Fact Sheet for Healthcare Providers:  https://www.young.biz/https://www.fda.gov/media/142435/download  This test is no t yet approved or cleared by the [place] FDA and  has been authorized for detection and/or diagnosis of SARS-CoV-2 by FDA under an Emergency Use Authorization (EUA). This EUA will remain  in effect (meaning this test can be used) for the duration of the COVID-19 declaration under Section 564(b)(1) of the Act, 21 U.S.C. section 360bbb-3(b)(1), unless the authorization is terminated or revoked sooner.     Influenza A by PCR NEGATIVE NEGATIVE   Influenza B by PCR NEGATIVE NEGATIVE    Comment: (NOTE) The Xpert Xpress SARS-CoV-2/FLU/RSV assay is intended as an aid in  the diagnosis of influenza from Nasopharyngeal swab specimens and  should not be used as a sole basis for treatment. Nasal washings and  aspirates are unacceptable for Xpert Xpress SARS-CoV-2/FLU/RSV  testing.  Fact Sheet for Patients: https://www.moore.com/https://www.fda.gov/media/142436/download  Fact Sheet for Healthcare Providers: https://www.young.biz/https://www.fda.gov/media/142435/download  This test is not yet approved or cleared by the [place] FDA and  has been authorized for detection and/or diagnosis of SARS-CoV-2 by  FDA under an Emergency Use Authorization (EUA). This EUA will remain  in effect (meaning this test can be used) for the duration of the  Covid-19 declaration under Section 564(b)(1) of the Act, 21  U.S.C. section 360bbb-3(b)(1), unless the authorization is  terminated or revoked. Performed at  [facility], [address] [place], [place] [address]     Chemistries  Recent Labs  Lab 09/08/20 1429  NA 139  K 4.2  CL 105  CO2 26  GLUCOSE 93  BUN 14  CREATININE 0.70  CALCIUM 9.4  AST 17  ALT 11  ALKPHOS 75  BILITOT 0.4   ------------------------------------------------------------------------------------------------------------------  ------------------------------------------------------------------------------------------------------------------ GFR: Estimated Creatinine Clearance: 96.6 mL/min (by C-G formula based on SCr of 0.7 mg/dL). Liver Function Tests: Recent Labs  Lab 09/08/20 1429  AST 17  ALT 11  ALKPHOS 75  BILITOT 0.4  PROT 7.7  ALBUMIN 4.0   No results for input(s): LIPASE, AMYLASE  in the last 168 hours. No results for input(s): AMMONIA in the last 168 hours. Coagulation Profile: No results for input(s): INR, PROTIME in the last 168 hours. Cardiac Enzymes: No results for input(s): CKTOTAL, CKMB, CKMBINDEX, TROPONINI in the last 168 hours. BNP (last 3 results) No results for input(s): PROBNP in the last 8760 hours. HbA1C: No results for input(s): HGBA1C in the last 72 hours. CBG: Recent Labs  Lab 09/08/20 1445  GLUCAP 96   Lipid Profile: No results for input(s): CHOL, HDL, LDLCALC, TRIG, CHOLHDL, LDLDIRECT in the last 72 hours. Thyroid Function Tests: No results for input(s): TSH, T4TOTAL, FREET4, T3FREE, THYROIDAB in the last 72 hours. Anemia Panel: No results for input(s): VITAMINB12, FOLATE, FERRITIN, TIBC, IRON, RETICCTPCT in the last 72 hours.  --------------------------------------------------------------------------------------------------------------- Urine analysis:    Component Value Date/Time   COLORURINE STRAW (A) 09/08/2020 1618   APPEARANCEUR CLEAR 09/08/2020 1618   LABSPEC 1.009 09/08/2020 1618   PHURINE 8.0 09/08/2020 1618   GLUCOSEU NEGATIVE 09/08/2020 1618   HGBUR MODERATE (A) 09/08/2020 1618   BILIRUBINUR  NEGATIVE 09/08/2020 1618   KETONESUR NEGATIVE 09/08/2020 1618   PROTEINUR NEGATIVE 09/08/2020 1618   UROBILINOGEN 1.0 07/12/2012 1002   NITRITE NEGATIVE 09/08/2020 1618   LEUKOCYTESUR NEGATIVE 09/08/2020 1618      Imaging Results:    CT Head Wo Contrast  Result Date: 09/08/2020 CLINICAL DATA:  Seizure EXAM: CT HEAD WITHOUT CONTRAST TECHNIQUE: Contiguous axial images were obtained from the base of the skull through the vertex without intravenous contrast. COMPARISON:  None. FINDINGS: Brain: No evidence of acute infarction, hemorrhage, hydrocephalus, extra-axial collection or mass lesion/mass effect. Vascular: No hyperdense vessel or unexpected calcification. Skull: Normal. Negative for fracture or focal lesion. Sinuses/Orbits: No acute finding. Other: None. IMPRESSION: No acute intracranial pathology. Electronically Signed   By: Lauralyn Primes M.D.   On: 09/08/2020 15:08    My personal review of EKG: Rhythm NSR, Rate 82/min, QTc 433,no Acute ST changes   Assessment & Plan:    Active Problems:   Psychogenic nonepileptic seizure   1. Psychogenic nonepileptic seizures 1. Reports 5 episodes today 2. 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 Due to childhood history neuro does recommend loading with Depakote, and continuing Depakote 500 mg 3 times daily 4. Neuro also recommends EEG-for this patient will be transferred to [facility] as we do not have EEG techs on the weekend 5. Seizure precautions 6. Neuro checks 7. Neuro advises Ativan only for episodes lasting longer than 5 minutes 8. Continue to monitor 2. Leukocytosis 1. White blood cell count of 11 2. No infectious symptoms 3. Continue to monitor 3. Nicotine dependence 1. Patient smokes a pack of cigarettes per day 2. Advised on cessation   DVT Prophylaxis-   Lovenox - SCDs   AM Labs Ordered, also please review Full  Orders  Family Communication: In prison custody - no family at bedside   Code Status:  FULL  Admission status: Observation   Time spent in minutes : 64   Azana Kiesler B Zierle-Ghosh DO

## 2020-09-08 NOTE — ED Notes (Signed)
Pt conts to have episodes of shaking all over her body. At times able to say her name several times and she look at me and stop

## 2020-09-08 NOTE — ED Notes (Signed)
Pt sitting up in bed  Drinking tea

## 2020-09-08 NOTE — ED Notes (Signed)
Teleneurology consult in process.

## 2020-09-08 NOTE — ED Notes (Signed)
Transported to CT 

## 2020-09-08 NOTE — ED Notes (Signed)
Ems reports that pt has been in jail for the past two weeks and has been off her medications for the past year, pt continues with intermittent seizure type activity but pt is able to answer questions and follow commands,

## 2020-09-08 NOTE — ED Notes (Signed)
Pt had another episode of shaking all over, eyes rolling in back of head. Incontinent of bladder. No post ictal period noted. Complaining of HA. EDP approved Tylenol

## 2020-09-08 NOTE — ED Notes (Signed)
Pt stated she wanted to go home, explained risks to pt, pt repeated back, hospitalist made aware, pt changing into personal clothes at this time.

## 2020-09-08 NOTE — Consult Note (Signed)
[facility] TeleNeurology Consult Services  Stat Consult  Date of Service:   09/08/2020 15:57:58  Impression:     .  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 Since childhood she has had episodes of right arm stiffening and head version sometimes complicated by fall and tooth fracture that is likely focal seizure with secondary generalization. In her adulthood, she has had episodes of loss of consciousness and shaking that are distractible that are likely PNES. She was started on carbamazepine and Depakote 2 years prior in the [place] [facility] system but has been off medications for 1 year due to loss of follow-up. Recommend starting Depakote today (1 g load followed by 500 mg 3 times daily) which may help for her epilepsy and mood disorder. Recommend 20-minute EEG. CT head unremarkable. She needs to be established with outpatient neurologist to continue to monitor. Would not treat generalized shaking spells that are distractible unless they last longer than 5 minutes or have significant hemodynamic change (desaturation below 85 for example). If she clusters multiple events in an hour, reasonable to give 0.5 mg of Ativan as anxiolytic and as antiseizure precaution. She is open to a psychiatric consultation as well.  CT HEAD: Showed No Acute Hemorrhage or Acute Core Infarct  Our recommendations are outlined below.  Laboratory Studies: Recommend Lipid panel Hemoglobin A1c  Nursing Recommendations: Telemetry, IV Fluids, avoid dextrose containing fluids, Maintain euglycemia Neuro checks q4 hrs x 24 hrs and then per shift Head of bed 30 degrees  Consultations: Recommend Speech therapy if failed dysphagia screen Physical therapy/Occupational therapy  Additional  Recommendations:     Metrics: [facility] Notification Time: 09/08/2020 15:56:33 Stamp Time: 09/08/2020 15:57:58 Callback Response Time: 09/08/2020 16:02:35   ----------------------------------------------------------------------------------------------------  Chief Complaint: seizures, pnes  History of Present Illness: Patient is a 29 year old Female.  History obtained from patient and patient's mother ([id number]). Patient presented from prison custody with 6 events of loss of consciousness with generalized shaking. On observation by ED, she is cognizant during these events and can answer basic questions and can be asked to stop these movements. Patient has good insight that these are likely psychogenic and stress related events. She states that it is her daughter's seventh birthday. She reports that she had several of these events the day prior as well. The patient and mother are both uncertain about her past medical history. The mother reports that ever since she was a child (approximately 71 to 69 years old) she would have events of an arm pulling towards her chest and a head version (uncertain of directionality) lasting several seconds. She believed it was behavioral and it was not until her daughter was in her teenage years that she was evaluated for the possibility of seizures. They both do not recall what work-up was done at that time. The patient reports that she has spells where she would suddenly lose consciousness and fall to the floor and has cracked several teeth because of it. She was hospitalized in the [place] prison system in 2019 and was started on carbamazepine and Depakote but she does not recall what work-up was done at that time. She has been off medications for at least 1 year due to loss of follow-up.           Examination: BP(126/95), Pulse(92), Blood Glucose(145) 1A: Level of Consciousness - Alert; keenly  responsive + 0 1B: Ask Month and Age - Both Questions  Right + 0 1C: Blink Eyes & Squeeze Hands - Performs Both Tasks + 0 2: Test Horizontal Extraocular Movements - Normal + 0 3: Test Visual Fields - No Visual Loss + 0 4: Test Facial Palsy (Use Grimace if Obtunded) - Normal symmetry + 0 5A: Test Left Arm Motor Drift - No Drift for 10 Seconds + 0 5B: Test Right Arm Motor Drift - No Drift for 10 Seconds + 0 6A: Test Left Leg Motor Drift - No Drift for 5 Seconds + 0 6B: Test Right Leg Motor Drift - No Drift for 5 Seconds + 0 7: Test Limb Ataxia (FNF/Heel-Shin) - No Ataxia + 0 8: Test Sensation - Normal; No sensory loss + 0 9: Test Language/Aphasia - Normal; No aphasia + 0 10: Test Dysarthria - Normal + 0 11: Test Extinction/Inattention - No abnormality + 0  NIHSS Score: 0   Patient/Family was informed the Neurology Consult would occur via TeleHealth consult by way of interactive audio and video telecommunications and consented to receiving care in this manner.  Patient is being evaluated for possible acute neurologic impairment and high probability of imminent or life-threatening deterioration. I spent total of 30 minutes providing care to this patient, including time for face to face visit via telemedicine, review of medical records, imaging studies and discussion of findings with providers, the patient and/or family.   Dr Lucianne Muss   [facility] [phone]  Case [id number]

## 2020-09-09 LAB — HIV ANTIBODY (ROUTINE TESTING W REFLEX): HIV Screen 4th Generation wRfx: NONREACTIVE

## 2020-09-10 LAB — GC/CHLAMYDIA PROBE AMP (~~LOC~~) NOT AT ARMC
Chlamydia: NEGATIVE
Comment: NEGATIVE
Comment: NORMAL
Neisseria Gonorrhea: NEGATIVE

## 2020-11-30 ENCOUNTER — Other Ambulatory Visit: Payer: Self-pay

## 2020-11-30 ENCOUNTER — Encounter (HOSPITAL_COMMUNITY): Payer: Self-pay

## 2020-11-30 ENCOUNTER — Emergency Department (HOSPITAL_COMMUNITY)
Admission: EM | Admit: 2020-11-30 | Discharge: 2020-11-30 | Disposition: A | Discharge status: Home / Self Care | Attending: Emergency Medicine | Admitting: Emergency Medicine

## 2020-11-30 DIAGNOSIS — F17200 Nicotine dependence, unspecified, uncomplicated: Secondary | ICD-10-CM | POA: Insufficient documentation

## 2020-11-30 DIAGNOSIS — R569 Unspecified convulsions: Secondary | ICD-10-CM | POA: Diagnosis present

## 2020-11-30 DIAGNOSIS — Z91040 Latex allergy status: Secondary | ICD-10-CM | POA: Diagnosis not present

## 2020-11-30 DIAGNOSIS — F445 Conversion disorder with seizures or convulsions: Secondary | ICD-10-CM

## 2020-11-30 LAB — COMPREHENSIVE METABOLIC PANEL
ALT: 10 U/L (ref 0–44)
AST: 17 U/L (ref 15–41)
Albumin: 3.8 g/dL (ref 3.5–5.0)
Alkaline Phosphatase: 44 U/L (ref 38–126)
Anion gap: 10 (ref 5–15)
BUN: 15 mg/dL (ref 6–20)
CO2: 23 mmol/L (ref 22–32)
Calcium: 9 mg/dL (ref 8.9–10.3)
Chloride: 105 mmol/L (ref 98–111)
Creatinine, Ser: 0.83 mg/dL (ref 0.44–1.00)
GFR, Estimated: >60 mL/min (ref >60)
Glucose, Bld: 144 mg/dL — ABNORMAL HIGH (ref 70–99)
Potassium: 4.1 mmol/L (ref 3.5–5.1)
Sodium: 138 mmol/L (ref 135–145)
Total Bilirubin: 0.3 mg/dL (ref 0.3–1.2)
Total Protein: 6.8 g/dL (ref 6.5–8.1)

## 2020-11-30 LAB — CBC WITH DIFFERENTIAL/PLATELET
Abs Immature Granulocytes: 0.03 10*3/uL (ref 0.00–0.07)
Basophils Absolute: 0 10*3/uL (ref 0.0–0.1)
Basophils Relative: 0 %
Eosinophils Absolute: 0.1 10*3/uL (ref 0.0–0.5)
Eosinophils Relative: 1 %
HCT: 41.6 % (ref 36.0–46.0)
Hemoglobin: 13.5 g/dL (ref 12.0–15.0)
Immature Granulocytes: 0 %
Lymphocytes Relative: 26 %
Lymphs Abs: 2.1 10*3/uL (ref 0.7–4.0)
MCH: 31.3 pg (ref 26.0–34.0)
MCHC: 32.5 g/dL (ref 30.0–36.0)
MCV: 96.5 fL (ref 80.0–100.0)
Monocytes Absolute: 0.5 10*3/uL (ref 0.1–1.0)
Monocytes Relative: 6 %
Neutro Abs: 5.4 10*3/uL (ref 1.7–7.7)
Neutrophils Relative %: 67 %
Platelets: 153 10*3/uL (ref 150–400)
RBC: 4.31 MIL/uL (ref 3.87–5.11)
RDW: 14.3 % (ref 11.5–15.5)
WBC: 8.2 10*3/uL (ref 4.0–10.5)
nRBC: 0 % (ref 0.0–0.2)

## 2020-11-30 LAB — I-STAT BETA HCG BLOOD, ED (MC, WL, AP ONLY): I-stat hCG, quantitative: <5 m[IU]/mL (ref <5)

## 2020-11-30 LAB — RAPID URINE DRUG SCREEN, HOSP PERFORMED
Amphetamines: NOT DETECTED
Barbiturates: NOT DETECTED
Benzodiazepines: POSITIVE — AB
Cocaine: NOT DETECTED
Opiates: NOT DETECTED
Tetrahydrocannabinol: NOT DETECTED

## 2020-11-30 LAB — VALPROIC ACID LEVEL: Valproic Acid Lvl: 38 ug/mL — ABNORMAL LOW (ref 50.0–100.0)

## 2020-11-30 LAB — ETHANOL: Alcohol, Ethyl (B): <10 mg/dL (ref <10)

## 2020-11-30 MED ORDER — DIVALPROEX SODIUM 250 MG PO DR TAB
500.0000 mg | DELAYED_RELEASE_TABLET | Freq: Once | ORAL | Status: AC
  Administered 2020-11-30: 500 mg via ORAL
  Filled 2020-11-30: qty 2

## 2020-11-30 NOTE — Discharge Instructions (Addendum)
Work-up without any significant findings.  Recommend increasing your Depakote 500 mg DR 3 times a day from your usual twice a day.

## 2020-11-30 NOTE — ED Provider Notes (Signed)
Emergency Department Provider Note   I have reviewed the triage vital signs and the nursing notes.   HISTORY  Chief Complaint Seizures   HPI Shelby Adkins is a 30 y.o. female presents to the emergency department from the [facility] with seizure activity.  EMS report arriving on scene to find the patient having some intermittent shaking and gave 5 mg of IM Ativan.  Patient tells me that she has been feeling lightheaded and having shaking/seizures today.  She denies any pain or fevers.  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  She notes a history of similar episodes going back to childhood. 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   Past Medical History:  Diagnosis Date  . Chronic back pain   . Seizures [facility])     Patient Active Problem List   Diagnosis Date Noted  . 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 BACK SURGERY     rod placement.    Allergies Penicillins, Aspirin, Ibuprofen, and Latex  No family history on file.  Social History Social History   Tobacco Use  . Smoking status: Current Every Day Smoker    Packs/day: 0.50  . Smokeless tobacco: Never Used  Substance Use Topics  . Alcohol use: Yes    Comment: occasionally  . Drug use: No    Review of Systems  Constitutional: No fever/chills Eyes: No visual changes. ENT: No sore throat. Cardiovascular: Denies chest pain. Respiratory: Denies shortness of breath. Gastrointestinal: No abdominal pain.  No nausea, no vomiting.  No diarrhea.  No constipation. Genitourinary: Negative for dysuria. Musculoskeletal: Negative for back pain. Skin: Negative for rash. Neurological: Negative for headaches, focal weakness or numbness. Positive seizure like activity.   10-point ROS otherwise  negative.  ____________________________________________   PHYSICAL EXAM:  VITAL SIGNS: Vitals:   11/30/20 1701 11/30/20 1824  BP: 105/72 112/65  Pulse: (!) 124 90  Resp: 20 20  Temp:    SpO2: 100% 100%    Constitutional: Patent arrives with rhythmic type tremor activity which she will abruptly stop and clearly answer questions when asked or engaged to move for an exam.  She then intermittently goes back to having rhythmic type shaking all over but remains alert.  Eyes: Conjunctivae are normal. PERRL. EOMI. Head: Atraumatic. Nose: No congestion/rhinnorhea. Mouth/Throat: Mucous membranes are moist.   Neck: No stridor.   Cardiovascular: Normal rate, regular rhythm. Good peripheral circulation. Grossly normal heart sounds.   Respiratory: Normal respiratory effort.  No retractions. Lungs CTAB. Gastrointestinal: Soft and nontender. No distention.  Musculoskeletal: No lower extremity tenderness nor edema. No gross deformities of extremities. Neurologic:  Normal speech and language. No gross focal neurologic deficits are appreciated.  Skin:  Skin is warm, dry and intact. No rash noted.   ____________________________________________   LABS (all labs ordered are listed, but only abnormal results are displayed)  Labs Reviewed  COMPREHENSIVE METABOLIC PANEL - Abnormal; Notable for the following components:      Result Value   Glucose, Bld 144 (*)    All other components within normal limits  RAPID URINE DRUG SCREEN, HOSP PERFORMED - Abnormal; Notable for the following components:   Benzodiazepines POSITIVE (*)    All other components within normal limits  VALPROIC ACID LEVEL - Abnormal; Notable for  the following components:   Valproic Acid Lvl 38 (*)    All other components within normal limits  ETHANOL  CBC WITH DIFFERENTIAL/PLATELET  I-STAT BETA HCG BLOOD, ED ([facility], [facility], [facility] ONLY)   ____________________________________________  EKG   EKG Interpretation  Date/Time:  Friday  November 30 2020 13:17:25 EST Ventricular Rate:  60 PR Interval:    QRS Duration: 97 QT Interval:  350 QTC Calculation: 350 R Axis:   93 Text Interpretation: Sinus rhythm Borderline short PR interval Borderline right axis deviation No STEMI Confirmed by Alona Bene [phone]) on 11/30/2020 1:26:36 PM       ____________________________________________  RADIOLOGY  None  ____________________________________________   PROCEDURES  Procedure(s) performed:   Procedures  None  ____________________________________________   INITIAL IMPRESSION / ASSESSMENT AND PLAN / ED COURSE  Pertinent labs & imaging results that were available during my care of the patient were reviewed by me and considered in my medical decision making (see chart for details).   Patient presents to the emergency department with concern for seizure activity.  She has history of PNES and the activity displayed in the emergency department today fits that diagnosis.  She has no postictal phase or stigmata of seizure.  Her EKG is unremarkable and vital signs are largely unremarkable on exam.  Patient has an ED visit from October with similar symptoms.  She was admitted for EEG but ultimately signed out AMA after deputies left her bedside.  During that evaluation she was loaded with Depakote and recommended not treating shaking spells that are distractible unless lasting longer than 5 minutes. CT head negative for acute findings at that time.   PICU level is pending.  Remaining labs are within normal limits.  Patient pregnancy test is negative.  She is ambulatory here with no epileptiform seizure activity.  Plan for Neurology follow up as an outpatient. Care transferred to Dr. Deretha Emory.  ____________________________________________  FINAL CLINICAL IMPRESSION(S) / ED DIAGNOSES  Final diagnoses:  Pseudoseizure     MEDICATIONS GIVEN DURING THIS VISIT:  Medications  divalproex (DEPAKOTE) DR tablet 500 mg (500 mg Oral  Given 11/30/20 1440)    Note:  This document was prepared using Dragon voice recognition software and may include unintentional dictation errors.  Alona Bene, MD, [facility]    Calum Cormier, Arlyss Repress, MD 12/03/20 [phone]

## 2020-11-30 NOTE — ED Provider Notes (Signed)
Went into discussed discharge with patient.  She has seizure vomited.  Did appear to be pseudoseizure.  There was no postictal phase.  There was some tonic-clonic movements.  We will continue to observe a little bit longer.  Patient's Depakote level was low.  At discharge we will have her take her Depakote 3 times a day instead of twice a day.   Vanetta Mulders, MD 11/30/20 [phone]

## 2020-11-30 NOTE — ED Notes (Signed)
Pt. Tolerated oral medications and oral fluids well.

## 2020-11-30 NOTE — ED Notes (Signed)
Pt. Alert and oriented x4. Pt. Has been sitting up in bed, talking with guard and no seizure activity since 1400.

## 2020-11-30 NOTE — ED Notes (Addendum)
Pt. Ambulated to restroom. This nurse took pt. Urine sample to lab. Upon return nurse Carollee Herter and Nurse Tiffany were assessing pt. In the restroom. Pt. Was shaking on the floor. Ammonia inhalent was held under pts. Nose. Pt. Opened eyes. Pt was asked to stand and pt. Stood and walked with the help of two nurses back to room. This nurse removed fluids and food from bedside due to aspiration risk and notified pt. That a purewick will need to be used for pts. Safety. Pt. Was assessed by this nurse. No bumps, abrasions or bruises present on pts. Body.  Pt. Denies any pain. Dr. Deretha Emory notified of pt. Status and charge nurse Feliz Beam was notified.

## 2020-11-30 NOTE — ED Notes (Signed)
Found pt. Ambulating in hallway with guard. Pt. Used restroom and ambulated back to room.

## 2020-11-30 NOTE — ED Notes (Signed)
Pt. Ambulated with officer through the ED. Pts. Gait was steady.

## 2020-11-30 NOTE — ED Notes (Signed)
Guard at bedside called out to nursing station stating that pt. " having a hard time breathing." Assessed pt. Pt. Was not blue in the face, lips or fingertips. Pts. RR was 20 and O2 sat was 100 on room air.

## 2020-11-30 NOTE — ED Notes (Signed)
Pt. Ambulated back to restroom. Requested pt. Provide urine sample. Pt. Provided urine sample and ambulated back to room.

## 2020-11-30 NOTE — ED Notes (Addendum)
Attempted to place IV. Pt. Seized during placement but was able to keep left arm straight during attempt.

## 2020-11-30 NOTE — ED Triage Notes (Signed)
Pt brought to ED via [facility] from [facility] for seizure lasting 8-9 [facility]. Pt with history of pseudoseizures. Pt given Versed 5mg  IM by EMS. Pt alert and oriented upon EMS arrival.

## 2021-01-08 IMAGING — CT CT HEAD W/O CM
3 series · 16 of 47 positions shown, 19 images · non-contrast
Comparison: None.

CLINICAL DATA: Seizure

EXAM:
CT HEAD WITHOUT CONTRAST
TECHNIQUE: Contiguous axial images were obtained from the base of the skull
through the vertex without intravenous contrast.

[Series 2: head w o · axial · 0.39mm/px · z∈[+45,+170]mm · 10 of 31 slices shown, 13 images]
[im 3/31  brain]
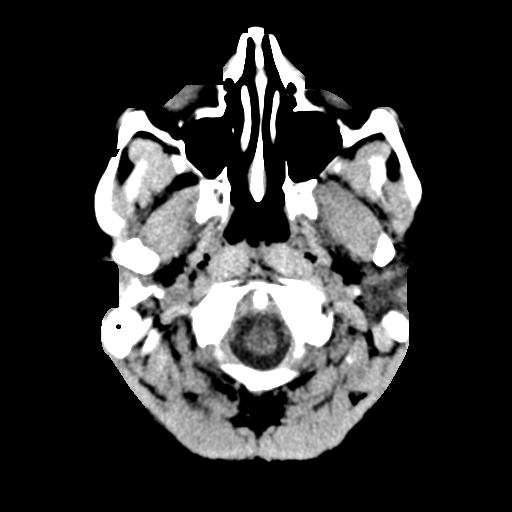
[im 3/31  bone]
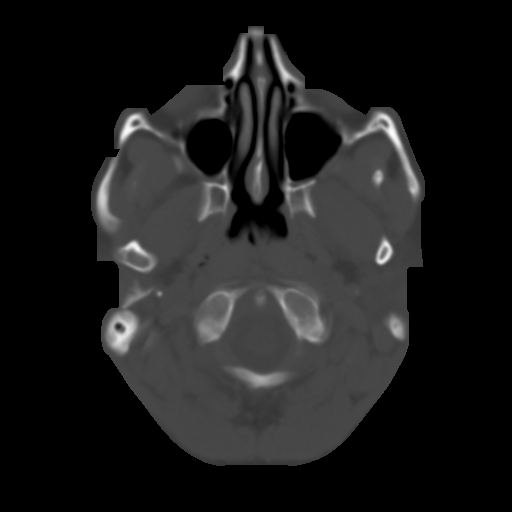
[im 6/31  brain]
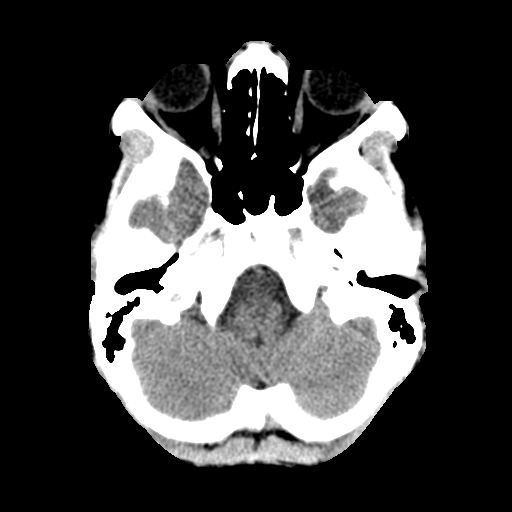
[im 9/31  brain]
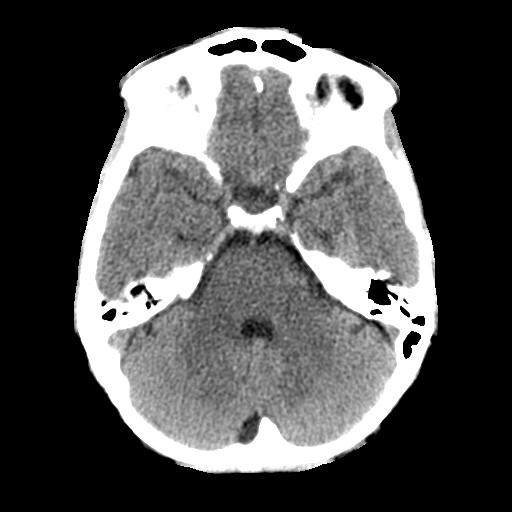
[im 11/31  brain]
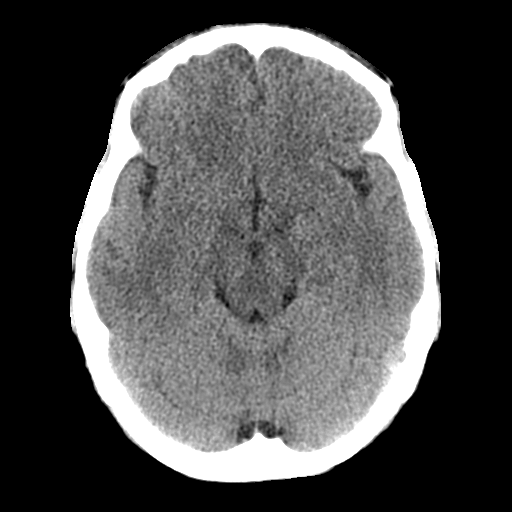
[im 14/31  brain]
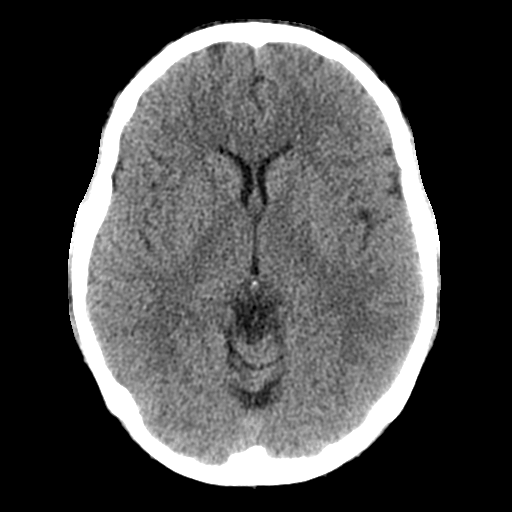
[im 14/31  bone]
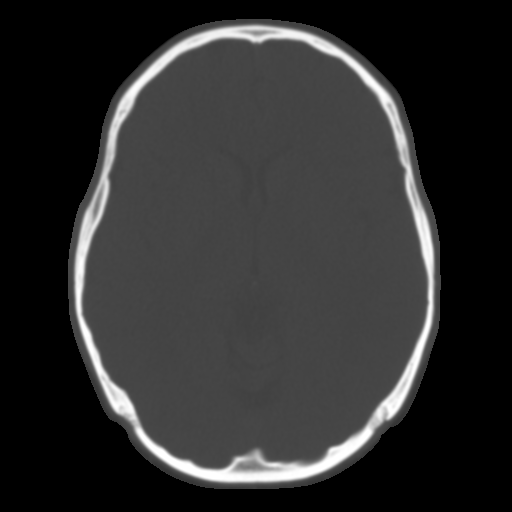
[im 17/31  brain]
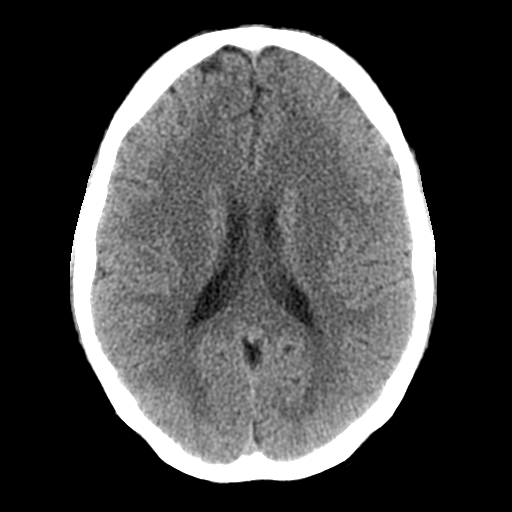
[im 20/31  brain]
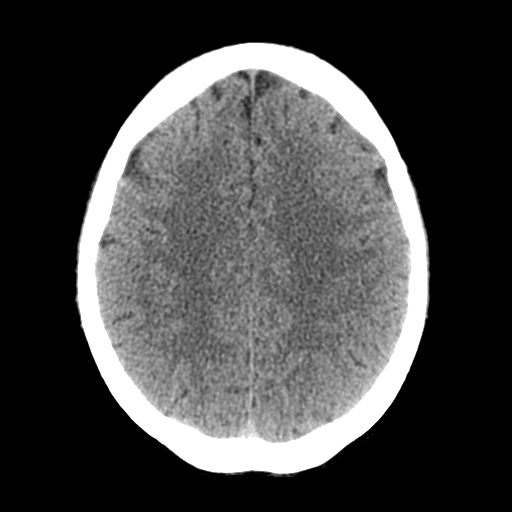
[im 23/31  brain]
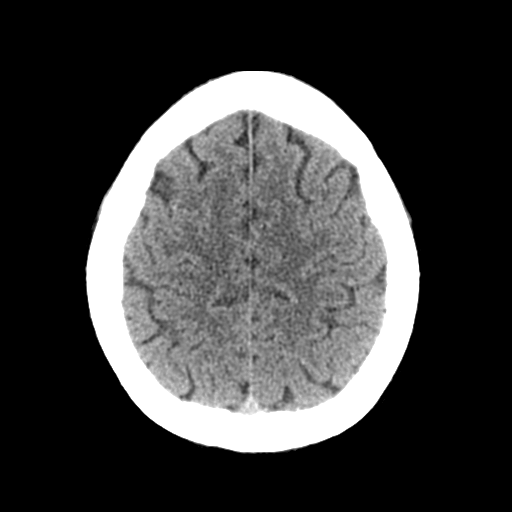
[im 25/31  brain]
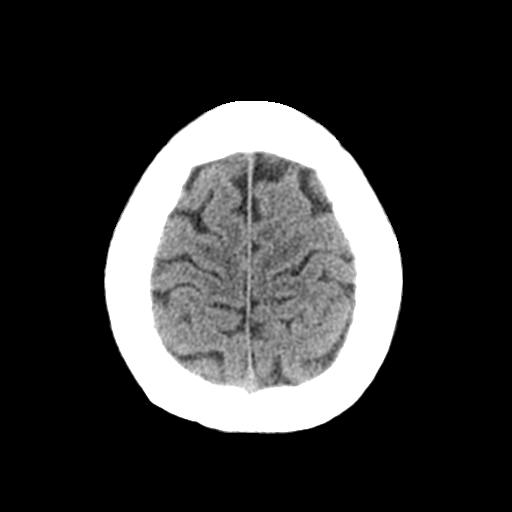
[im 25/31  bone]
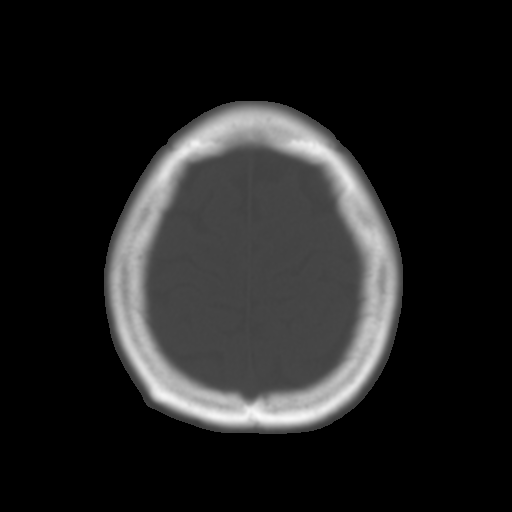
[im 28/31  brain]
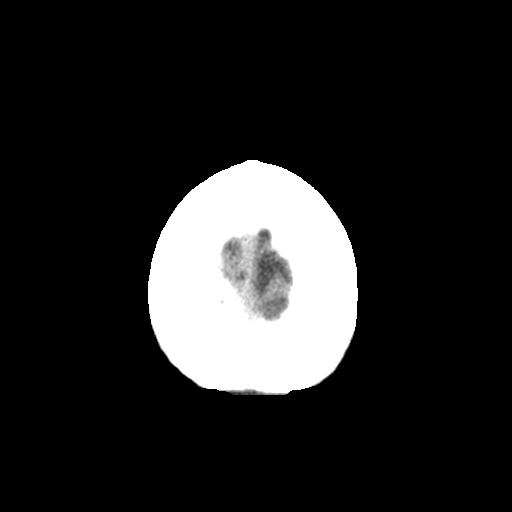

[Series 4: coronal soft · coronal · 0.31mm/px · 3 of 67 slices shown]
[im 23/67  brain]
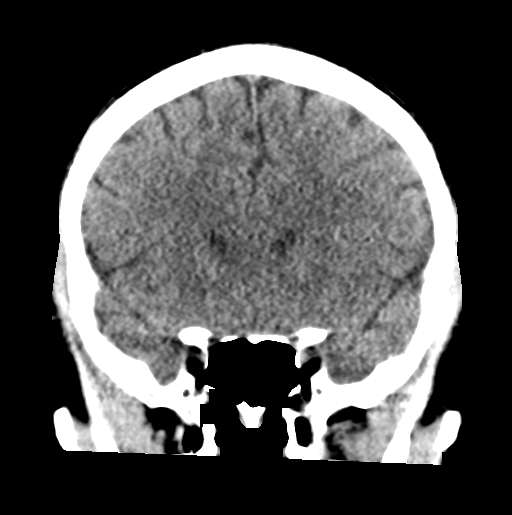
[im 30/67  brain]
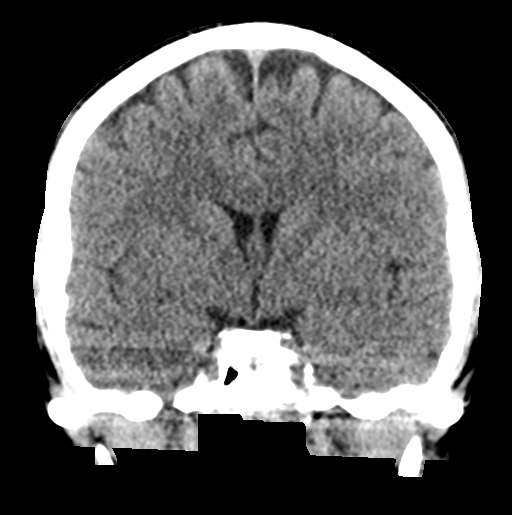
[im 37/67  brain]
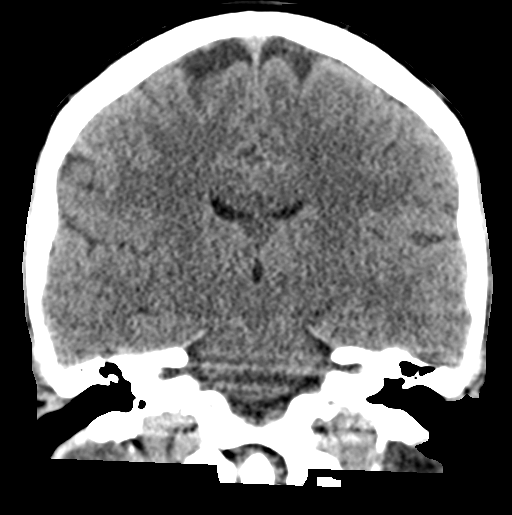

[Series 5: sagittal soft · sagittal · 0.31mm/px · 3 of 53 slices shown]
[im 18/53  brain]
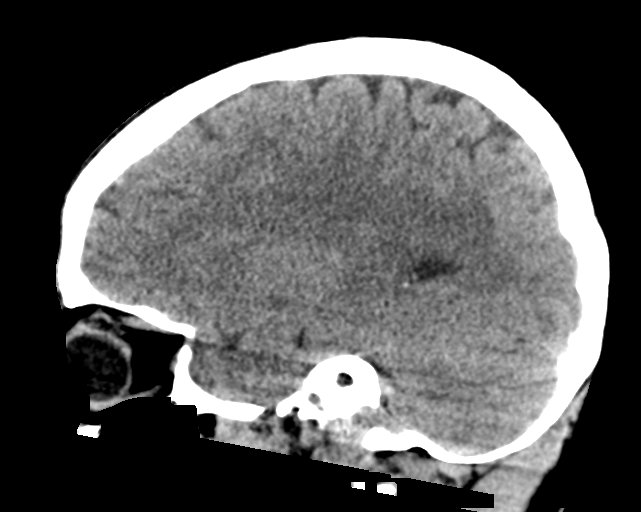
[im 27/53  brain]
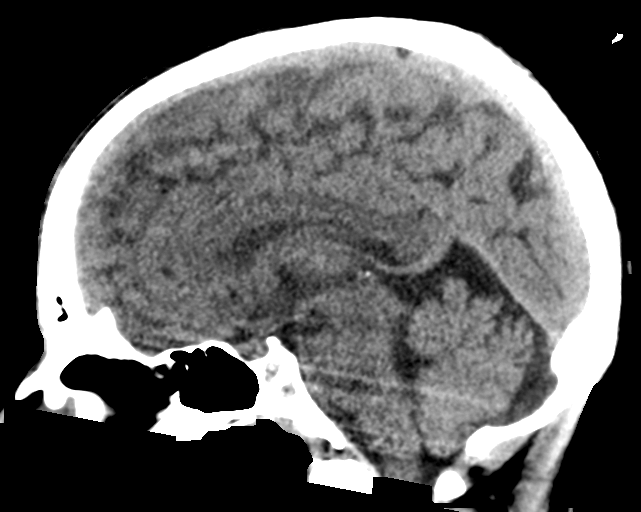
[im 35/53  brain]
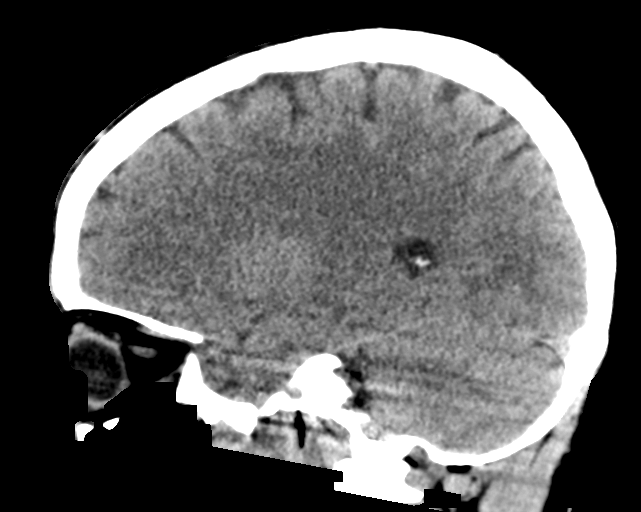

[16 of 47 positions shown; findings below may reference images not displayed]

FINDINGS: Brain: No evidence of acute infarction, hemorrhage, hydrocephalus,
extra-axial collection or mass lesion/mass effect.

Vascular: No hyperdense vessel or unexpected calcification.

Skull: Normal. Negative for fracture or focal lesion.

Sinuses/Orbits: No acute finding.

Other: None.
IMPRESSION: No acute intracranial pathology.

## 2021-06-24 DEATH — deceased
